# Patient Record
Sex: Female | Born: 2000
Health system: Southern US, Community
[De-identification: ages and names within clinical notes are randomized; demographics above are authoritative.]

## PROBLEM LIST (undated history)

## (undated) DIAGNOSIS — D649 Anemia, unspecified: Secondary | ICD-10-CM

## (undated) DIAGNOSIS — Z789 Other specified health status: Secondary | ICD-10-CM

## (undated) DIAGNOSIS — Z8489 Family history of other specified conditions: Secondary | ICD-10-CM

## (undated) HISTORY — DX: Other specified health status: Z78.9

## (undated) HISTORY — PX: NO PAST SURGERIES: SHX2092

## (undated) HISTORY — DX: Family history of other specified conditions: Z84.89

## (undated) NOTE — *Deleted (*Deleted)
LABOR AND DELIVERY ADMISSION HISTORY AND PHYSICAL NOTE  Teresa [name] is a 39 y.o. female G1P0 with IUP at [redacted]w[redacted]d by *** presenting for ***.  She reports positive fetal movement. She denies leakage of fluid or vaginal bleeding.  Prenatal History/Complications: PNC at *** Pregnancy complications:  - ***  Past Medical History: Past Medical History:  Diagnosis Date  . Family history of adverse reaction to anesthesia    lidocaine does not work on her mother  . Medical history non-contributory     Past Surgical History: Past Surgical History:  Procedure Laterality Date  . NO PAST SURGERIES      Obstetrical History: OB History    Gravida  1   Para      Term      Preterm      AB      Living  0     SAB      TAB      Ectopic      Multiple      Live Births              Social History: Social History   Socioeconomic History  . Marital status: Single    Spouse name: Not on file  . Number of children: Not on file  . Years of education: Not on file  . Highest education level: Associate degree: occupational, Scientist, product/process development, or vocational program  Occupational History    Comment: Car Dealership  Tobacco Use  . Smoking status: Never Smoker  . Smokeless tobacco: Never Used  Vaping Use  . Vaping Use: Never used  Substance and Sexual Activity  . Alcohol use: Not Currently  . Drug use: Never  . Sexual activity: Not Currently    Birth control/protection: None  Other Topics Concern  . Not on file  Social History Narrative   Late to care   Social Determinants of Health   Financial Resource Strain:   . Difficulty of Paying Living Expenses: Not on file  Food Insecurity:   . Worried About Programme researcher, broadcasting/film/video in the Last Year: Not on file  . Ran Out of Food in the Last Year: Not on file  Transportation Needs:   . Lack of Transportation (Medical): Not on file  . Lack of Transportation (Non-Medical): Not on file  Physical Activity:   . Days of Exercise per  Week: Not on file  . Minutes of Exercise per Session: Not on file  Stress:   . Feeling of Stress : Not on file  Social Connections:   . Frequency of Communication with Friends and Family: Not on file  . Frequency of Social Gatherings with Friends and Family: Not on file  . Attends Religious Services: Not on file  . Active Member of Clubs or Organizations: Not on file  . Attends Banker Meetings: Not on file  . Marital Status: Not on file    Family History: Family History  Problem Relation Age of Onset  . Diabetes Father     Allergies: No Known Allergies  Medications Prior to Admission  Medication Sig Dispense Refill Last Dose  . Prenatal Vit w/Fe-Methylfol-FA (PNV PO) Take by mouth.   07/01/2020 at Unknown time     Review of Systems  All systems reviewed and negative except as stated in HPI  Physical Exam Blood pressure 128/78, pulse (!) 114, temperature 98.4 F (36.9 C), temperature source Oral, resp. rate 20, height 5\' 7"  (1.702 m), weight 110 kg, last menstrual period  09/23/2019, SpO2 99 %. General appearance: alert, oriented, NAD*** Lungs: normal respiratory effort Heart: regular rate Abdomen: soft, non-tender; gravid, FH appropriate for [place] Extremities: No calf swelling or tenderness Presentation: cephalic*** Fetal monitoring: *** Uterine activity: ***    Prenatal labs: ABO, Rh: A/Positive/-- (08/31 0902) Antibody: Negative (08/31 0902) Rubella: 1.95 (08/31 0902) RPR: Non Reactive (08/31 0902)  HBsAg: Negative (08/31 0902)  HIV: Non Reactive (08/31 0902)  GC/Chlamydia: *** GBS: Negative/-- (10/07 0913)  2-hr GTT: *** Genetic screening:  *** Anatomy US: ***  Prenatal Transfer Tool  Maternal Diabetes: {Maternal Diabetes:3043596} Genetic Screening: {Genetic Screening:20205} Maternal Ultrasounds/Referrals: {Maternal Ultrasounds / Referrals:20211} Fetal Ultrasounds or other Referrals:  {Fetal Ultrasounds or Other Referrals:20213} Maternal  Substance Abuse:  {Maternal Substance Abuse:20223} Significant Maternal Medications:  {Significant Maternal Meds:20233} Significant Maternal Lab Results: {Significant Maternal Lab Results:20235}  Results for orders placed or performed during the hospital encounter of 07/02/20 (from the past 24 hour(s))  [employer] Time: 07/02/20  1:42 PM  Result Value Ref Range   POCT Fern Test Positive = ruptured amniotic membanes     Patient Active Problem List   Diagnosis Date Noted  . Late prenatal care affecting pregnancy, antepartum 04/29/2020  . Supervision of low-risk pregnancy 12/12/2019    Assessment: Teresa [name] is a 80 y.o. G1P0 at [redacted]w[redacted]d here for ***  #Labor: *** #Pain: *** #FWB: *** #ID:  *** #MOF: *** #MOC:*** #Circ:  ***  De Hollingshead 07/02/2020, 1:56 PM

---
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
## 2019-08-31 NOTE — L&D Delivery Note (Addendum)
Patient: Teresa [name] MRN: [id number]  GBS status: Neg, IAP not given  Patient is a 19 y.o. now G1P1 s/p NSVD at [redacted]w[redacted]d, who was admitted for PROM. ROM 34h 35m prior to delivery with light meconium fluid.    Delivery Note At 9:25 AM a viable and healthy female was delivered via Vaginal, Spontaneous (Presentation: ROA ).  APGAR: 9, 9; weight  7 lb 11.5 oz (3501).    Placenta status: Spontaneous,  intact.  Cord: 3 vessels  with the following complications: None.  Cord pH: N/a; gases not obtained.   Head delivered LOA. No nuchal cord present. Shoulder and body delivered in usual fashion. Infant with spontaneous cry, placed on mother's abdomen, dried and bulb suctioned. Cord clamped x 2 after 1-minute delay, and cut by family member. Cord blood drawn. Placenta delivered spontaneously with gentle cord traction. Fundus firm with massage and Pitocin. Perineum inspected and found to have 1st degree laceration, which was found to be hemostatic. Which was not in need of repair.   Anesthesia: Epidural Episiotomy:  N/A Lacerations: 1st degree perineum Suture Repair:  no repair necessary; hemostatic   Est. Blood Loss (mL):  150  Mom to postpartum.  Baby to Couplet care / Skin to Skin.  Lavonda Jumbo, DO PGY-2, [facility] 07/03/2020, 9:40 AM  I was present and gloved for the entire delivery. I agree with the findings and the plan of care as documented in the resident's note.  Casper Harrison, MD [facility] Fellow, [facility] for [facility], [facility]

## 2019-11-08 ENCOUNTER — Other Ambulatory Visit: Payer: Self-pay

## 2019-11-08 ENCOUNTER — Encounter: Payer: Self-pay | Admitting: Adult Health Nurse Practitioner

## 2019-11-08 ENCOUNTER — Telehealth: Payer: Self-pay | Admitting: Adult Health Nurse Practitioner

## 2019-11-08 ENCOUNTER — Ambulatory Visit (INDEPENDENT_AMBULATORY_CARE_PROVIDER_SITE_OTHER): Payer: BC Managed Care – PPO | Admitting: Adult Health Nurse Practitioner

## 2019-11-08 VITALS — BP 115/75 | HR 79 | Temp 98.3°F | Ht 67.0 in | Wt 219.8 lb

## 2019-11-08 DIAGNOSIS — Z3201 Encounter for pregnancy test, result positive: Secondary | ICD-10-CM | POA: Diagnosis not present

## 2019-11-08 DIAGNOSIS — Z3A01 Less than 8 weeks gestation of pregnancy: Secondary | ICD-10-CM | POA: Insufficient documentation

## 2019-11-08 DIAGNOSIS — N926 Irregular menstruation, unspecified: Secondary | ICD-10-CM | POA: Diagnosis not present

## 2019-11-08 LAB — POCT URINE PREGNANCY: Preg Test, Ur: POSITIVE — AB

## 2019-11-08 NOTE — Progress Notes (Signed)
  Chief Complaint  Patient presents with  . Establish Care    Pregnacy confirmation    HPI   Patient presents after a missed period.  LmP was 09/23/19.  She has taken a pregnancy test at home which was positive.  She does want to keep the pregnancy.  She will be 7 weeks on March 15.  Due date Nov. 1  Only experiencing a little nausea first thing in the a.m. She is taking PNVs.    Problem List    Problem List: There are no relevant problems documented for this patient.   Allergies   has no allergies on file.  Medications   No current outpatient medications on file.   Review of Systems    Constitutional: Negative for activity change, appetite change, chills and fever.  HENT: Negative for congestion, nosebleeds, trouble swallowing and voice change.   Respiratory: Negative for cough, shortness of breath and wheezing.   Cardiac:  Negative for chest pain, pressure, syncope  Gastrointestinal: Negative for diarrhea, nausea and vomiting.  Genitourinary: Negative for difficulty urinating, dysuria, flank pain and hematuria.  Musculoskeletal: Negative for back pain, joint swelling and neck pain.  Neurological: Negative for dizziness, speech difficulty, light-headedness and numbness.  See HPI. All other review of systems negative.     Physical Exam:    height is 5\' 7"  (1.702 m) and weight is 219 lb 12.8 oz (99.7 kg). Her temporal temperature is 98.3 F (36.8 C). Her blood pressure is 115/75 and her pulse is 79. Her oxygen saturation is 98%.   Physical Examination: General appearance - alert, well appearing, and in no distress and oriented to person, place, and time Mental status - normal mood, behavior, speech, dress, motor activity, and thought processes Eyes - PERRL. Extraocular movements intact.  No nystagmus.  Neck - supple, no significant adenopathy, carotids upstroke normal bilaterally, no bruits, thyroid exam: thyroid is normal in size without nodules or tenderness Chest -  clear to auscultation, no wheezes, rales or rhonchi, symmetric air entry  Heart - normal rate, regular rhythm, normal S1, S2, no murmurs, rubs, clicks or gallops Extremities - dependent LE edema without clubbing or cyanosis Skin - normal coloration and turgor, no rashes, no suspicious skin lesions noted  No hyperpigmentation of skin.  No current hematomas noted   Lab /Imaging Review    Positive UPT.   Assessment & Plan:  Teresa [name] is a 19 y.o. female    1. Missed period   2. Less than [redacted] weeks gestation of pregnancy    Orders Placed This Encounter  Procedures  . Ambulatory referral to Obstetrics / Gynecology  . POCT urine pregnancy   Reviewed trimesters and expectations.  All questions answered.   26, NP

## 2019-11-08 NOTE — Telephone Encounter (Signed)
Patient I wanting a call back with urine pregnancy results

## 2019-11-08 NOTE — Telephone Encounter (Signed)
Spoke with pt to let her know that it is confirmed that she is preg.

## 2019-11-08 NOTE — Patient Instructions (Signed)
° ° ° °  If you have lab work done today you will be contacted with your lab results within the next 2 weeks.  If you have not heard from us then please contact us. The fastest way to get your results is to register for My Chart. ° ° °IF you received an x-ray today, you will receive an invoice from [facility]. Please contact  Radiology at [phone] with questions or concerns regarding your invoice.  ° °IF you received labwork today, you will receive an invoice from [facility]. Please contact [facility] at [phone] with questions or concerns regarding your invoice.  ° °Our billing staff will not be able to assist you with questions regarding bills from these companies. ° °You will be contacted with the lab results as soon as they are available. The fastest way to get your results is to activate your My Chart account. Instructions are located on the last page of this paperwork. If you have not heard from us regarding the results in 2 weeks, please contact this office. °  ° ° ° °

## 2019-11-21 DIAGNOSIS — Z7189 Other specified counseling: Secondary | ICD-10-CM | POA: Diagnosis not present

## 2019-11-21 DIAGNOSIS — Z20828 Contact with and (suspected) exposure to other viral communicable diseases: Secondary | ICD-10-CM | POA: Diagnosis not present

## 2019-12-12 ENCOUNTER — Telehealth: Payer: Self-pay | Admitting: Family Medicine

## 2019-12-12 ENCOUNTER — Ambulatory Visit (INDEPENDENT_AMBULATORY_CARE_PROVIDER_SITE_OTHER): Payer: Self-pay | Admitting: *Deleted

## 2019-12-12 DIAGNOSIS — Z3490 Encounter for supervision of normal pregnancy, unspecified, unspecified trimester: Secondary | ICD-10-CM

## 2019-12-12 NOTE — Telephone Encounter (Signed)
Attempted to contact patient to get her rescheduled for her missed intake appointment. No answer, left voicemail for patient to give the office a call back.

## 2019-12-12 NOTE — Progress Notes (Signed)
I connected with  Teresa [name] on 12/12/19 at  2:30 PM EDT by telephone and verified that I am speaking with the correct person using two identifiers.   I discussed the limitations, risks, security and privacy concerns of performing an evaluation and management service by telephone and the availability of in person appointments. I also discussed with the patient that there may be a patient responsible charge related to this service. The patient expressed understanding and agreed to proceed. Call was disconnected. I called back to [name] three times  and left messages our call was disconnected and I am calling back to finish our visit. If we do not reconnect ; you will need to call us to reschedule your appointment.  Shalin Linders,RN  12/12/2019  2:23 PM   2:40pm; I called [name] again and left another message I am calling to see if we can reconnect so we can complete your visit. Please call our office to reschedule your appointments.  Anshi Jalloh,RN

## 2019-12-13 ENCOUNTER — Encounter: Payer: Self-pay | Admitting: Obstetrics and Gynecology

## 2019-12-19 ENCOUNTER — Telehealth: Payer: Self-pay | Admitting: Family Medicine

## 2019-12-19 NOTE — Telephone Encounter (Signed)
Attempted to contact patient to get her scheduled for her intake appointment at our office to start prenatal care. Patient answered the phone and verbalized that it was her. When I introduced myself patient hung up the phone. Patient was called x4 and it went straight to voicemail. A message was left for patient to give the office a call back. New on intake appointment was canceled

## 2020-04-17 ENCOUNTER — Telehealth: Payer: Self-pay | Admitting: General Practice

## 2020-04-17 NOTE — Telephone Encounter (Signed)
Left message on VM informing patient of appt has been rescheduled to 04/29/2020 at 8:30am.  Asked patient to give our office a call.

## 2020-04-29 ENCOUNTER — Other Ambulatory Visit: Payer: Self-pay

## 2020-04-29 ENCOUNTER — Ambulatory Visit (INDEPENDENT_AMBULATORY_CARE_PROVIDER_SITE_OTHER): Payer: BC Managed Care – PPO | Admitting: *Deleted

## 2020-04-29 VITALS — BP 107/59 | HR 84 | Temp 98.0°F | Wt 226.8 lb

## 2020-04-29 DIAGNOSIS — Z3490 Encounter for supervision of normal pregnancy, unspecified, unspecified trimester: Secondary | ICD-10-CM

## 2020-04-29 DIAGNOSIS — Z31430 Encounter of female for testing for genetic disease carrier status for procreative management: Secondary | ICD-10-CM | POA: Diagnosis not present

## 2020-04-29 DIAGNOSIS — O0930 Supervision of pregnancy with insufficient antenatal care, unspecified trimester: Secondary | ICD-10-CM | POA: Insufficient documentation

## 2020-04-29 DIAGNOSIS — Z3482 Encounter for supervision of other normal pregnancy, second trimester: Secondary | ICD-10-CM | POA: Diagnosis not present

## 2020-04-29 NOTE — Progress Notes (Signed)
   PRENATAL INTAKE SUMMARY  Teresa [name] presents today New OB Nurse Interview.  OB History    Gravida  1   Para      Term      Preterm      AB      Living        SAB      TAB      Ectopic      Multiple      Live Births             I have reviewed the patient's medical, obstetrical, social, and family histories, medications, and available lab results.  SUBJECTIVE She has no unusual complaints.  OBJECTIVE Initial nurse interview for history and labs (New OB).  LATE TO CARE  EDD: 07/05/20 by ultrasound completed in [place]:[redacted]w[redacted]d G1P0 FHT: 143  GENERAL APPEARANCE: alert, well appearing, in no apparent distress, oriented to person, place and time   ASSESSMENT Normal pregnancy  PLAN Prenatal care-[facility] Pnl/HIV/Hep C OB Urine Culture GC/CT- to be completed at next visit 05/08/20 Teresa [name], CNM HgbEval/SMA/CF ([facility]) Panorama 2 hr gtt Ultrasound MFM +14 complete for anatomy scan  Teresa Pu, RN

## 2020-04-29 NOTE — Patient Instructions (Addendum)
Third Trimester of Pregnancy  The third trimester is from week 28 through week 40 (months 7 through 9). This trimester is when your unborn baby (fetus) is growing very fast. At the end of the ninth month, the unborn baby is about 20 inches in length. It weighs about 6-10 pounds. Follow these instructions at home: Medicines  Take over-the-counter and prescription medicines only as told by your doctor. Some medicines are safe and some medicines are not safe during pregnancy.  Take a prenatal vitamin that contains at least 600 micrograms (mcg) of folic acid.  If you have trouble pooping (constipation), take medicine that will make your stool soft (stool softener) if your doctor approves. Eating and drinking   Eat regular, healthy meals.  Avoid raw meat and uncooked cheese.  If you get low calcium from the food you eat, talk to your doctor about taking a daily calcium supplement.  Eat four or five small meals rather than three large meals a day.  Avoid foods that are high in fat and sugars, such as fried and sweet foods.  To prevent constipation: ? Eat foods that are high in fiber, like fresh fruits and vegetables, whole grains, and beans. ? Drink enough fluids to keep your pee (urine) clear or pale yellow. Activity  Exercise only as told by your doctor. Stop exercising if you start to have cramps.  Avoid heavy lifting, wear low heels, and sit up straight.  Do not exercise if it is too hot, too humid, or if you are in a place of great height (high altitude).  You may continue to have sex unless your doctor tells you not to. Relieving pain and discomfort  Wear a good support bra if your breasts are tender.  Take frequent breaks and rest with your legs raised if you have leg cramps or low back pain.  Take warm water baths (sitz baths) to soothe pain or discomfort caused by hemorrhoids. Use hemorrhoid cream if your doctor approves.  If you develop puffy, bulging veins  (varicose veins) in your legs: ? Wear support hose or compression stockings as told by your doctor. ? Raise (elevate) your feet for 15 minutes, 3-4 times a day. ? Limit salt in your food. Safety  Wear your seat belt when driving.  Make a list of emergency phone numbers, including numbers for family, friends, the hospital, and police and fire departments. Preparing for your baby's arrival To prepare for the arrival of your baby:  Take prenatal classes.  Practice driving to the hospital.  Visit the hospital and tour the maternity area.  Talk to your work about taking leave once the baby comes.  Pack your hospital bag.  Prepare the baby's room.  Go to your doctor visits.  Buy a rear-facing car seat. Learn how to install it in your car. General instructions  Do not use hot tubs, steam rooms, or saunas.  Do not use any products that contain nicotine or tobacco, such as cigarettes and e-cigarettes. If you need help quitting, ask your doctor.  Do not drink alcohol.  Do not douche or use tampons or scented sanitary pads.  Do not cross your legs for long periods of time.  Do not travel for long distances unless you must. Only do so if your doctor says it is okay.  Visit your dentist if you have not gone during your pregnancy. Use a soft toothbrush to brush your teeth. Be gentle when you floss.  Avoid cat litter boxes  and soil used by cats. These carry germs that can cause birth defects in the baby and can cause a loss of your baby (miscarriage) or stillbirth.  Keep all your prenatal visits as told by your doctor. This is important. Contact a doctor if:  You are not sure if you are in labor or if your water has broken.  You are dizzy.  You have mild cramps or pressure in your lower belly.  You have a nagging pain in your belly area.  You continue to feel sick to your stomach, you throw up, or you have watery poop.  You have bad smelling fluid coming from your  vagina.  You have pain when you pee. Get help right away if:  You have a fever.  You are leaking fluid from your vagina.  You are spotting or bleeding from your vagina.  You have severe belly cramps or pain.  You lose or gain weight quickly.  You have trouble catching your breath and have chest pain.  You notice sudden or extreme puffiness (swelling) of your face, hands, ankles, feet, or legs.  You have not felt the baby move in over an hour.  You have severe headaches that do not go away with medicine.  You have trouble seeing.  You are leaking, or you are having a gush of fluid, from your vagina before you are 37 weeks.  You have regular belly spasms (contractions) before you are 37 weeks. Summary  The third trimester is from week 28 through week 40 (months 7 through 9). This time is when your unborn baby is growing very fast.  Follow your doctor's advice about medicine, food, and activity.  Get ready for the arrival of your baby by taking prenatal classes, getting all the baby items ready, preparing the baby's room, and visiting your doctor to be checked.  Get help right away if you are bleeding from your vagina, or you have chest pain and trouble catching your breath, or if you have not felt your baby move in over an hour. This information is not intended to replace advice given to you by your health care provider. Make sure you discuss any questions you have with your health care provider. Document Revised: 12/07/2018 Document Reviewed: 09/21/2016 Elsevier Patient Education  2020 [employer].  Warning Signs During Pregnancy A pregnancy lasts about 40 weeks, starting from the first day of your last period until the baby is born. Pregnancy is divided into three phases called trimesters.  The first trimester refers to week 1 through week 13 of pregnancy.  The second trimester is the start of week 14 through the end of week 27.  The third trimester is the start of  week 28 until you deliver your baby. During each trimester of pregnancy, certain signs and symptoms may indicate a problem. Talk with your health care provider about your current health and any medical conditions you have. Make sure you know the symptoms that you should watch for and report. How does this affect me?  Warning signs in the first trimester While some changes during the first trimester may be uncomfortable, most do not represent a serious problem. Let your health care provider know if you have any of the following warning signs in the first trimester:  You cannot eat or drink without vomiting, and this lasts for longer than a day.  You have vaginal bleeding or spotting along with menstrual-like cramping.  You have diarrhea for longer than a day.  You have a fever or other signs of infection, such as: ? Pain or burning when you urinate. ? Foul smelling or thick or yellowish vaginal discharge. Warning signs in the second trimester As your baby grows and changes during the second trimester, there are additional signs and symptoms that may indicate a problem. These include:  Signs and symptoms of infection, including a fever.  Signs or symptoms of a miscarriage or preterm labor, such as regular contractions, menstrual-like cramping, or lower abdominal pain.  Bloody or watery vaginal discharge or obvious vaginal bleeding.  Feeling like your heart is pounding.  Having trouble breathing.  Nausea, vomiting, or diarrhea that lasts for longer than a day.  Craving non-food items, such as clay, chalk, or dirt. This may be a sign of a very treatable medical condition called pica. Later in your second trimester, watch for signs and symptoms of a serious medical condition called preeclampsia.These include:  Changes in your vision.  A severe headache that does not go away.  Nausea and vomiting. It is also important to notice if your baby stops moving or moves less than usual  during this time. Warning signs in the third trimester As you approach the third trimester, your baby is growing and your body is preparing for the birth of your baby. In your third trimester, be sure to let your health care provider know if:  You have signs and symptoms of infection, including a fever.  You have vaginal bleeding.  You notice that your baby is moving less than usual or is not moving.  You have nausea, vomiting, or diarrhea that lasts for longer than a day.  You have a severe headache that does not go away.  You have vision changes, including seeing spots or having blurry or double vision.  You have increased swelling in your hands or face. How does this affect my baby? Throughout your pregnancy, always report any of the warning signs of a problem to your health care provider. This can help prevent complications that may affect your baby, including:  Increased risk for premature birth.  Infection that may be transmitted to your baby.  Increased risk for stillbirth. Contact a health care provider if:  You have any of the warning signs of a problem for the current trimester of your pregnancy.  Any of the following apply to you during any trimester of pregnancy: ? You have strong emotions, such as sadness or anxiety, that interfere with work or personal relationships. ? You feel unsafe in your home and need help finding a safe place to live. ? You are using tobacco products, alcohol, or drugs and you need help to stop. Get help right away if: You have signs or symptoms of labor before 37 weeks of pregnancy. These include:  Contractions that are 5 minutes or less apart, or that increase in frequency, intensity, or length.  Sudden, sharp abdominal pain or low back pain.  Uncontrolled gush or trickle of fluid from your vagina. Summary  A pregnancy lasts about 40 weeks, starting from the first day of your last period until the baby is born. Pregnancy is divided  into three phases called trimesters. Each trimester has warning signs to watch for.  Always report any warning signs to your health care provider in order to prevent complications that may affect both you and your baby.  Talk with your health care provider about your current health and any medical conditions you have. Make sure you know the symptoms that  you should watch for and report. This information is not intended to replace advice given to you by your health care provider. Make sure you discuss any questions you have with your health care provider. Document Revised: 12/05/2018 Document Reviewed: 06/02/2017 Elsevier Patient Education  2020 [employer].  Genetic Testing During Pregnancy Genetic testing during pregnancy is also called prenatal genetic testing. This type of testing can determine if your baby is at risk of being born with a disorder caused by abnormal genes or chromosomes (genetic disorder). Chromosomes contain genes that control how your baby will develop in your womb. There are many different genetic disorders. Examples of genetic disorders that may be found through genetic testing include Down syndrome and cystic fibrosis. Gene changes (mutations) can be passed down through families. Genetic testing is offered to all women before or during pregnancy. You can choose whether to have genetic testing. Why is genetic testing done? Genetic testing is done during pregnancy to find out whether your child is at risk for a genetic disorder. Having genetic testing allows you to:  Discuss your test results and options with a genetic counselor.  Prepare for a baby that may be born with a genetic disorder. Learning about the disorder ahead of time helps you be better prepared to manage it. Your health care providers can also be prepared in case your baby requires special care before or after birth.  Consider whether you want to continue with the pregnancy. In some cases, genetic testing  may be done to learn about the traits a child will inherit. Types of genetic tests There are two basic types of genetic testing. Screening tests indicate whether your developing baby (fetus) is at higher risk for a genetic disorder. Diagnostic tests check actual fetal cells to diagnose a genetic disorder. Screening tests     Screening tests will not harm your baby. They are recommended for all pregnant women. Types of screening tests include:  Carrier screening. This test involves checking genes from both parents by testing their blood or saliva. The test checks to find out if the parents carry a genetic mutation that may be passed to a baby. In most cases, both parents must carry the mutation for a baby to be at risk.  First trimester screening. This test combines a blood test with sound wave imaging of your baby (fetal ultrasound). This screening test checks for a risk of Down syndrome or other defects caused by having extra chromosomes. It also checks for defects of the heart, abdomen, or skeleton.  Second trimester screening also combines a blood test with a fetal ultrasound exam. It checks for a risk of genetic defects of the face, brain, spine, heart, or limbs.  Combined or sequential screening. This type of testing combines the results of first and second trimester screening. This type of testing may be more accurate than first or second trimester screening alone.  Cell-free DNA testing. This is a blood test that detects cells released by the placenta that get into the mother's blood. It can be used to check for a risk of Down syndrome, other extra chromosome syndromes, and disorders caused by abnormal numbers of sex chromosomes. This test can be done any time after 10 weeks of pregnancy.  Diagnostic tests Diagnostic tests carry slight risks of problems, including bleeding, infection, and loss of the pregnancy. These tests are done only if your baby is at risk for a genetic disorder. You  may meet with a genetic counselor to discuss the  risks and benefits before having diagnostic tests. Examples of diagnostic tests include:  Chorionic villus sampling (CVS). This involves a procedure to remove and test a sample of cells taken from the placenta. The procedure may be done between 10 and 12 weeks of pregnancy.  Amniocentesis. This involves a procedure to remove and test a sample of fluid (amniotic fluid) and cells from the sac that surrounds the developing baby. The procedure may be done between 15 and 20 weeks of pregnancy. What do the results mean? For a screening test:  If the results are negative, it often means that your child is not at higher risk. There is still a slight chance your child could have a genetic disorder.  If the results are positive, it does not mean your child will have a genetic disorder. It may mean that your child has a higher-than-normal risk for a genetic disorder. In that case, you may want to talk with a genetic counselor about whether you should have diagnostic genetic tests. For a diagnostic test:  If the result is negative, it is unlikely that your child will have a genetic disorder.  If the test is positive for a genetic disorder, it is likely that your child will have the disorder. The test may not tell how severe the disorder will be. Talk with your health care provider about your options. Questions to ask your health care provider Before talking to your health care provider about genetic testing, find out if there is a history of genetic disorders in your family. It may also help to know your family's ethnic origins. Then ask your health care provider the following questions:  Is my baby at risk for a genetic disorder?  What are the benefits of having genetic screening?  What tests are best for me and my baby?  What are the risks of each test?  If I get a positive result on a screening test, what is the next step?  Should I meet with a  genetic counselor before having a diagnostic test?  Should my partner or other members of my family be tested?  How much do the tests cost? Will my insurance cover the testing? Summary  Genetic testing is done during pregnancy to find out whether your child is at risk for a genetic disorder.  Genetic testing is offered to all women before or during pregnancy. You can choose whether to have genetic testing.  There are two basic types of genetic testing. Screening tests indicate whether your developing baby (fetus) is at higher risk for a genetic disorder. Diagnostic tests check actual fetal cells to diagnose a genetic disorder.  If a diagnostic genetic test is positive, talk with your health care provider about your options. This information is not intended to replace advice given to you by your health care provider. Make sure you discuss any questions you have with your health care provider. Document Revised: 12/07/2018 Document Reviewed: 10/31/2017 Elsevier Patient Education  2020 [employer].

## 2020-04-30 ENCOUNTER — Encounter: Payer: Self-pay | Admitting: General Practice

## 2020-04-30 ENCOUNTER — Other Ambulatory Visit (HOSPITAL_COMMUNITY)
Admission: RE | Admit: 2020-04-30 | Discharge: 2020-04-30 | Disposition: A | Discharge status: Home / Self Care | Payer: BC Managed Care – PPO | Source: Ambulatory Visit | Attending: Obstetrics and Gynecology | Admitting: Obstetrics and Gynecology

## 2020-04-30 ENCOUNTER — Ambulatory Visit (INDEPENDENT_AMBULATORY_CARE_PROVIDER_SITE_OTHER): Payer: BC Managed Care – PPO | Admitting: Obstetrics and Gynecology

## 2020-04-30 ENCOUNTER — Encounter: Payer: Self-pay | Admitting: Obstetrics and Gynecology

## 2020-04-30 VITALS — BP 116/72 | HR 90 | Temp 98.2°F | Wt 228.0 lb

## 2020-04-30 DIAGNOSIS — O0930 Supervision of pregnancy with insufficient antenatal care, unspecified trimester: Secondary | ICD-10-CM

## 2020-04-30 DIAGNOSIS — O26843 Uterine size-date discrepancy, third trimester: Secondary | ICD-10-CM

## 2020-04-30 DIAGNOSIS — Z3490 Encounter for supervision of normal pregnancy, unspecified, unspecified trimester: Secondary | ICD-10-CM | POA: Diagnosis not present

## 2020-04-30 DIAGNOSIS — Z3A30 30 weeks gestation of pregnancy: Secondary | ICD-10-CM

## 2020-04-30 LAB — CBC/D/PLT+RPR+RH+ABO+RUB AB...
Antibody Screen: NEGATIVE
Basophils Absolute: 0 10*3/uL (ref 0.0–0.2)
Basos: 0 %
EOS (ABSOLUTE): 0 10*3/uL (ref 0.0–0.4)
Eos: 0 %
HCV Ab: <0.1 s/co ratio (ref 0.0–0.9)
HIV Screen 4th Generation wRfx: NONREACTIVE
Hematocrit: 31.1 % — ABNORMAL LOW (ref 34.0–46.6)
Hemoglobin: 10.6 g/dL — ABNORMAL LOW (ref 11.1–15.9)
Hepatitis B Surface Ag: NEGATIVE
Immature Grans (Abs): 0.1 10*3/uL (ref 0.0–0.1)
Immature Granulocytes: 1 %
Lymphocytes Absolute: 2.4 10*3/uL (ref 0.7–3.1)
Lymphs: 24 %
MCH: 30.8 pg (ref 26.6–33.0)
MCHC: 34.1 g/dL (ref 31.5–35.7)
MCV: 90 fL (ref 79–97)
Monocytes Absolute: 0.7 10*3/uL (ref 0.1–0.9)
Monocytes: 7 %
Neutrophils Absolute: 6.6 10*3/uL (ref 1.4–7.0)
Neutrophils: 68 %
Platelets: 201 10*3/uL (ref 150–450)
RBC: 3.44 x10E6/uL — ABNORMAL LOW (ref 3.77–5.28)
RDW: 13.4 % (ref 11.7–15.4)
RPR Ser Ql: NONREACTIVE
Rh Factor: POSITIVE
Rubella Antibodies, IGG: 1.95 index (ref >0.99)
WBC: 9.8 10*3/uL (ref 3.4–10.8)

## 2020-04-30 LAB — GLUCOSE TOLERANCE, 2 HOURS W/ 1HR
Glucose, 1 hour: 84 mg/dL (ref 65–179)
Glucose, 2 hour: 92 mg/dL (ref 65–152)
Glucose, Fasting: 69 mg/dL (ref 65–91)

## 2020-04-30 LAB — HCV INTERPRETATION

## 2020-04-30 NOTE — Patient Instructions (Signed)
Iron-Rich Diet  Iron is a mineral that helps your body to produce hemoglobin. Hemoglobin is a protein in red blood cells that carries oxygen to your body's tissues. Eating too little iron may cause you to feel weak and tired, and it can increase your risk of infection. Iron is naturally found in many foods, and many foods have iron added to them (iron-fortified foods). You may need to follow an iron-rich diet if you do not have enough iron in your body due to certain medical conditions. The amount of iron that you need each day depends on your age, your sex, and any medical conditions you have. Follow instructions from your health care provider or a diet and nutrition specialist (dietitian) about how much iron you should eat each day. What are tips for following this plan? Reading food labels  Check food labels to see how many milligrams (mg) of iron are in each serving. Cooking  Cook foods in pots and pans that are made from iron.  Take these steps to make it easier for your body to absorb iron from certain foods: ? Soak beans overnight before cooking. ? Soak whole grains overnight and drain them before using. ? Ferment flours before baking, such as by using yeast in bread dough. Meal planning  When you eat foods that contain iron, you should eat them with foods that are high in vitamin C. These include oranges, peppers, tomatoes, potatoes, and mango. Vitamin C helps your body to absorb iron. General information  Take iron supplements only as told by your health care provider. An overdose of iron can be life-threatening. If you were prescribed iron supplements, take them with orange juice or a vitamin C supplement.  When you eat iron-fortified foods or take an iron supplement, you should also eat foods that naturally contain iron, such as meat, poultry, and fish. Eating naturally iron-rich foods helps your body to absorb the iron that is added to other foods or contained in a  supplement.  Certain foods and drinks prevent your body from absorbing iron properly. Avoid eating these foods in the same meal as iron-rich foods or with iron supplements. These foods include: ? Coffee, black tea, and red wine. ? Milk, dairy products, and foods that are high in calcium. ? Beans and soybeans. ? Whole grains. What foods should I eat? Fruits Prunes. Raisins. Eat fruits high in vitamin C, such as oranges, grapefruits, and strawberries, alongside iron-rich foods. Vegetables Spinach (cooked). Green peas. Broccoli. Fermented vegetables. Eat vegetables high in vitamin C, such as leafy greens, potatoes, bell peppers, and tomatoes, alongside iron-rich foods. Grains Iron-fortified breakfast cereal. Iron-fortified whole-wheat bread. Enriched rice. Sprouted grains. Meats and other proteins Beef liver. Oysters. Beef. Shrimp. Turkey. Chicken. Tuna. Sardines. Chickpeas. Nuts. Tofu. Pumpkin seeds. Beverages Tomato juice. Fresh orange juice. Prune juice. Hibiscus tea. Fortified instant breakfast shakes. Sweets and desserts Blackstrap molasses. Seasonings and condiments Tahini. Fermented soy sauce. Other foods Wheat germ. The items listed above may not be a complete list of recommended foods and beverages. Contact a dietitian for more information. What foods should I avoid? Grains Whole grains. Bran cereal. Bran flour. Oats. Meats and other proteins Soybeans. Products made from soy protein. Black beans. Lentils. Mung beans. Split peas. Dairy Milk. Cream. Cheese. Yogurt. Cottage cheese. Beverages Coffee. Black tea. Red wine. Sweets and desserts Cocoa. Chocolate. Ice cream. Other foods Basil. Oregano. Large amounts of parsley. The items listed above may not be a complete list of foods and beverages to avoid.   Contact a dietitian for more information. Summary  Iron is a mineral that helps your body to produce hemoglobin. Hemoglobin is a protein in red blood cells that carries  oxygen to your body's tissues.  Iron is naturally found in many foods, and many foods have iron added to them (iron-fortified foods).  When you eat foods that contain iron, you should eat them with foods that are high in vitamin C. Vitamin C helps your body to absorb iron.  Certain foods and drinks prevent your body from absorbing iron properly, such as whole grains and dairy products. You should avoid eating these foods in the same meal as iron-rich foods or with iron supplements. This information is not intended to replace advice given to you by your health care provider. Make sure you discuss any questions you have with your health care provider. Document Revised: 07/29/2017 Document Reviewed: 07/12/2017 Elsevier Patient Education  2020 [employer] Fetal Movement Counts Patient Name: ________________________________________________ Patient Due Date: ____________________ What is a fetal movement count?  A fetal movement count is the number of times that you feel your baby move during a certain amount of time. This may also be called a fetal kick count. A fetal movement count is recommended for every pregnant woman. You may be asked to start counting fetal movements as early as week 28 of your pregnancy. Pay attention to when your baby is most active. You may notice your baby's sleep and wake cycles. You may also notice things that make your baby move more. You should do a fetal movement count:  When your baby is normally most active.  At the same time each day. A good time to count movements is while you are resting, after having something to eat and drink. How do I count fetal movements? 1. Find a quiet, comfortable area. Sit, or lie down on your side. 2. Write down the date, the start time and stop time, and the number of movements that you felt between those two times. Take this information with you to your health care visits. 3. Write down your start time when you feel the first  movement. 4. Count kicks, flutters, swishes, rolls, and jabs. You should feel at least 10 movements. 5. You may stop counting after you have felt 10 movements, or if you have been counting for 2 hours. Write down the stop time. 6. If you do not feel 10 movements in 2 hours, contact your health care provider for further instructions. Your health care provider may want to do additional tests to assess your baby's well-being. Contact a health care provider if:  You feel fewer than 10 movements in 2 hours.  Your baby is not moving like he or she usually does. Date: ____________ Start time: ____________ Stop time: ____________ Movements: ____________ Date: ____________ Start time: ____________ Stop time: ____________ Movements: ____________ Date: ____________ Start time: ____________ Stop time: ____________ Movements: ____________ Date: ____________ Start time: ____________ Stop time: ____________ Movements: ____________ Date: ____________ Start time: ____________ Stop time: ____________ Movements: ____________ Date: ____________ Start time: ____________ Stop time: ____________ Movements: ____________ Date: ____________ Start time: ____________ Stop time: ____________ Movements: ____________ Date: ____________ Start time: ____________ Stop time: ____________ Movements: ____________ Date: ____________ Start time: ____________ Stop time: ____________ Movements: ____________ This information is not intended to replace advice given to you by your health care provider. Make sure you discuss any questions you have with your health care provider. Document Revised: 04/05/2019 Document Reviewed: 04/05/2019 Elsevier Patient Education  2020 [employer]  

## 2020-04-30 NOTE — Progress Notes (Signed)
INITIAL OBSTETRICAL VISIT Patient name: Teresa [name] MRN [id number]  Date of birth: 03-11-2001 Chief Complaint:   Initial Prenatal Visit  History of Present Illness:   Teresa [name] is a 19 y.o. G1P0 Hispanic female at [redacted]w[redacted]d by LMP with an Estimated Date of Delivery: 07/05/20 being seen today for her initial obstetrical visit.  Her obstetrical history is significant for obesity and late to care. She reports that she found out she was pregnant in March of this year. She did not seek PNC, because she had to go to [place] to take care of her sick grandmother. She reports having only U/S and no OB care while in [place]; copies in scanned documents. This is an unplanned pregnancy. She and the father of the baby (FOB) "Teresa [name]" live together. She has a support system that consists of the FOB/her family/friends. Today she reports no complaints.   Patient's last menstrual period was 09/23/2019. Last pap n/a. Results were: never had before d/t age Review of Systems:   Pertinent items are noted in HPI Denies cramping/contractions, leakage of fluid, vaginal bleeding, abnormal vaginal discharge w/ itching/odor/irritation, headaches, visual changes, shortness of breath, chest pain, abdominal pain, severe nausea/vomiting, or problems with urination or bowel movements unless otherwise stated above.  Pertinent History Reviewed:  Reviewed past medical,surgical, social, obstetrical and family history.  Reviewed problem list, medications and allergies. OB History  Gravida Para Term Preterm AB Living  1            SAB TAB Ectopic Multiple Live Births               # Outcome Date GA Lbr Len/2nd Weight Sex Delivery Anes PTL Lv  1 Current            Physical Assessment:   Vitals:   04/30/20 1029  BP: 116/72  Pulse: 90  Temp: 98.2 F (36.8 C)  Weight: 228 lb (103.4 kg)  Body mass index is 35.71 kg/m.       Physical Examination:  General appearance - well appearing, and in no  distress  Mental status - alert, oriented to person, place, and time  Psych:  She has a normal mood and affect  Skin - warm and dry, normal color, no suspicious lesions noted  Chest - effort normal, all lung fields clear to auscultation bilaterally  Heart - normal rate and regular rhythm  Abdomen - soft, nontender  Extremities:  No swelling or varicosities noted  Pelvic - VULVA: normal appearing vulva with no masses, tenderness or lesions  VAGINA: normal appearing vagina with normal color and discharge, no lesions.   CERVIX: normal appearing cervix without discharge or lesions, no CMT  Thin prep pap is done with reflex HR HPV cotesting   FHTs by doppler: 150 bpm  Assessment & Plan:  1) Low-Risk Pregnancy G1P0 at [redacted]w[redacted]d with an Estimated Date of Delivery: 07/05/20   2) Initial OB visit - Welcomed to practice and introduced self to patient in addition to discussing other advanced practice providers that she may be seeing at this practice - Congratulated patient - Anticipatory guidance on upcoming appointments - Educated on COVID19 and pregnancy and the integration of virtual appointments  - Educated on babyscripts app- patient reports she has not received email, encouraged to look in spam folder and to call office if she still has not received email - patient verbalizes understanding    3) Late prenatal care affecting pregnancy, antepartum - U/S records from [place] scanned under media  tab   4) Encounter for supervision of low-risk pregnancy, antepartum - Cytology - PAP( [place]) - Cervicovaginal ancillary only( [place]) - Information provided on FKC and iron rich diet  5) [redacted] weeks gestation of pregnancy    Meds: No orders of the defined types were placed in this encounter.   Initial labs obtained Continue prenatal vitamins Reviewed n/v relief measures and warning s/s to report Reviewed recommended weight gain based on pre-gravid BMI Encouraged well-balanced diet Genetic  Screening discussed: ordered Cystic fibrosis, SMA, Fragile X screening discussed ordered The nature of  - [facility] with multiple MDs and other Advanced Practice Providers was explained to patient; also emphasized that residents, students are part of our team.  Discussed optimized OB schedule and video visits. Advised can have an in-office visit whenever she feels she needs to be seen.  Advised to call during normal business hours and there is an after-hours nurse line available.    Follow-up: Return in about 2 weeks (around 05/14/2020) for Return OB visit.   No orders of the defined types were placed in this encounter.   [name] MSN, CNM 04/30/2020

## 2020-05-01 LAB — CERVICOVAGINAL ANCILLARY ONLY
Bacterial Vaginitis (gardnerella): NEGATIVE
Candida Glabrata: NEGATIVE
Candida Vaginitis: NEGATIVE
Chlamydia: NEGATIVE
Comment: NEGATIVE
Comment: NEGATIVE
Comment: NEGATIVE
Comment: NEGATIVE
Comment: NEGATIVE
Comment: NORMAL
Neisseria Gonorrhea: NEGATIVE
Trichomonas: NEGATIVE

## 2020-05-01 LAB — CYTOLOGY - PAP: Diagnosis: NEGATIVE

## 2020-05-01 LAB — CULTURE, OB URINE

## 2020-05-01 LAB — URINE CULTURE, OB REFLEX

## 2020-05-02 ENCOUNTER — Ambulatory Visit: Payer: BC Managed Care – PPO | Attending: Obstetrics and Gynecology

## 2020-05-02 ENCOUNTER — Ambulatory Visit: Payer: BC Managed Care – PPO | Admitting: *Deleted

## 2020-05-02 ENCOUNTER — Other Ambulatory Visit: Payer: Self-pay | Admitting: *Deleted

## 2020-05-02 ENCOUNTER — Other Ambulatory Visit: Payer: Self-pay

## 2020-05-02 DIAGNOSIS — O0930 Supervision of pregnancy with insufficient antenatal care, unspecified trimester: Secondary | ICD-10-CM

## 2020-05-02 DIAGNOSIS — Z3689 Encounter for other specified antenatal screening: Secondary | ICD-10-CM

## 2020-05-02 DIAGNOSIS — Z3490 Encounter for supervision of normal pregnancy, unspecified, unspecified trimester: Secondary | ICD-10-CM

## 2020-05-02 DIAGNOSIS — O26843 Uterine size-date discrepancy, third trimester: Secondary | ICD-10-CM

## 2020-05-07 ENCOUNTER — Encounter: Payer: Self-pay | Admitting: General Practice

## 2020-05-08 ENCOUNTER — Encounter: Payer: BC Managed Care – PPO | Admitting: Obstetrics and Gynecology

## 2020-05-14 ENCOUNTER — Encounter: Payer: Self-pay | Admitting: General Practice

## 2020-05-15 ENCOUNTER — Encounter: Payer: Self-pay | Admitting: Obstetrics and Gynecology

## 2020-05-15 ENCOUNTER — Other Ambulatory Visit: Payer: Self-pay

## 2020-05-15 ENCOUNTER — Ambulatory Visit (INDEPENDENT_AMBULATORY_CARE_PROVIDER_SITE_OTHER): Payer: BC Managed Care – PPO | Admitting: Obstetrics and Gynecology

## 2020-05-15 VITALS — BP 112/75 | HR 81 | Temp 97.7°F | Wt 232.4 lb

## 2020-05-15 DIAGNOSIS — Z3493 Encounter for supervision of normal pregnancy, unspecified, third trimester: Secondary | ICD-10-CM

## 2020-05-15 DIAGNOSIS — Z3A33 33 weeks gestation of pregnancy: Secondary | ICD-10-CM

## 2020-05-15 NOTE — Progress Notes (Signed)
   LOW-RISK PREGNANCY OFFICE VISIT Patient name: Teresa [name] MRN [id number]  Date of birth: [birth year] Chief Complaint:   Routine Prenatal Visit  History of Present Illness:   Teresa [name] is a 19 y.o. G1P0 female at [redacted]w[redacted]d with an Estimated Date of Delivery: 06/29/20 being seen today for ongoing management of a low-risk pregnancy.  Today she reports no complaints. Contractions: Not present. Vag. Bleeding: None.  Movement: Present. denies leaking of fluid. Review of Systems:   Pertinent items are noted in HPI Denies abnormal vaginal discharge w/ itching/odor/irritation, headaches, visual changes, shortness of breath, chest pain, abdominal pain, severe nausea/vomiting, or problems with urination or bowel movements unless otherwise stated above. Pertinent History Reviewed:  Reviewed past medical,surgical, social, obstetrical and family history.  Reviewed problem list, medications and allergies. Physical Assessment:   Vitals:   05/15/20 0840  BP: 112/75  Pulse: 81  Temp: 97.7 F (36.5 C)  Weight: 232 lb 6.4 oz (105.4 kg)  Body mass index is 36.4 kg/m.        Physical Examination:   General appearance: Well appearing, and in no distress  Mental status: Alert, oriented to person, place, and time  Skin: Warm & dry  Cardiovascular: Normal heart rate noted  Respiratory: Normal respiratory effort, no distress  Abdomen: Soft, gravid, nontender  Pelvic: Cervical exam deferred         Extremities: Edema: None  Fetal Status: Fetal Heart Rate (bpm): 141   Movement: Present    No results found for this or any previous visit (from the past 24 hour(s)).  Assessment & Plan:  1) Low-risk pregnancy G1P0 at [redacted]w[redacted]d with an Estimated Date of Delivery: 06/29/20   2) Encounter for supervision of low-risk pregnancy in third trimester - Anticipatory guidance for GBS and cervical exam next visit - Discussed rotation of visits after 36 wks   3) [redacted] weeks gestation of pregnancy    Meds:  No orders of the defined types were placed in this encounter.  Labs/procedures today: none  Plan:  Continue routine obstetrical care   Reviewed: Preterm labor symptoms and general obstetric precautions including but not limited to vaginal bleeding, contractions, leaking of fluid and fetal movement were reviewed in detail with the patient.  All questions were answered. No BP cuff at home.   Follow-up: Return in about 3 weeks (around 06/05/2020) for Return OB w/GBS.  No orders of the defined types were placed in this encounter.  [name] MSN, CNM 05/15/2020 8:50 AM

## 2020-05-29 ENCOUNTER — Ambulatory Visit: Payer: BC Managed Care – PPO | Attending: Obstetrics and Gynecology

## 2020-05-29 ENCOUNTER — Other Ambulatory Visit: Payer: Self-pay

## 2020-05-29 ENCOUNTER — Ambulatory Visit: Payer: BC Managed Care – PPO

## 2020-05-29 DIAGNOSIS — Z362 Encounter for other antenatal screening follow-up: Secondary | ICD-10-CM | POA: Diagnosis not present

## 2020-05-29 DIAGNOSIS — Z3A35 35 weeks gestation of pregnancy: Secondary | ICD-10-CM | POA: Diagnosis not present

## 2020-05-29 DIAGNOSIS — Z3689 Encounter for other specified antenatal screening: Secondary | ICD-10-CM

## 2020-05-29 DIAGNOSIS — O0933 Supervision of pregnancy with insufficient antenatal care, third trimester: Secondary | ICD-10-CM | POA: Diagnosis not present

## 2020-06-05 ENCOUNTER — Ambulatory Visit (INDEPENDENT_AMBULATORY_CARE_PROVIDER_SITE_OTHER): Payer: BC Managed Care – PPO | Admitting: Obstetrics and Gynecology

## 2020-06-05 ENCOUNTER — Other Ambulatory Visit (HOSPITAL_COMMUNITY)
Admission: RE | Admit: 2020-06-05 | Discharge: 2020-06-05 | Disposition: A | Discharge status: Home / Self Care | Payer: BC Managed Care – PPO | Source: Ambulatory Visit | Attending: Obstetrics and Gynecology | Admitting: Obstetrics and Gynecology

## 2020-06-05 ENCOUNTER — Other Ambulatory Visit: Payer: Self-pay

## 2020-06-05 VITALS — BP 106/72 | HR 83 | Temp 98.2°F | Wt 236.2 lb

## 2020-06-05 DIAGNOSIS — Z3490 Encounter for supervision of normal pregnancy, unspecified, unspecified trimester: Secondary | ICD-10-CM | POA: Insufficient documentation

## 2020-06-05 DIAGNOSIS — Z3A36 36 weeks gestation of pregnancy: Secondary | ICD-10-CM

## 2020-06-06 LAB — CERVICOVAGINAL ANCILLARY ONLY
Bacterial Vaginitis (gardnerella): NEGATIVE
Candida Glabrata: NEGATIVE
Candida Vaginitis: NEGATIVE
Chlamydia: NEGATIVE
Comment: NEGATIVE
Comment: NEGATIVE
Comment: NEGATIVE
Comment: NEGATIVE
Comment: NEGATIVE
Comment: NORMAL
Neisseria Gonorrhea: NEGATIVE
Trichomonas: NEGATIVE

## 2020-06-06 NOTE — Progress Notes (Signed)
   LOW-RISK PREGNANCY OFFICE VISIT Patient name: Teresa [name] MRN [id number]  Date of birth: [birth year] Chief Complaint:   Routine Prenatal Visit  History of Present Illness:   Teresa [name] is a 19 y.o. G1P0 female at [redacted]w[redacted]d with an Estimated Date of Delivery: 06/29/20 being seen today for ongoing management of a low-risk pregnancy.  Today she reports no complaints. Contractions: Not present. Vag. Bleeding: None.  Movement: Present. denies leaking of fluid. Review of Systems:   Pertinent items are noted in HPI Denies abnormal vaginal discharge w/ itching/odor/irritation, headaches, visual changes, shortness of breath, chest pain, abdominal pain, severe nausea/vomiting, or problems with urination or bowel movements unless otherwise stated above. Pertinent History Reviewed:  Reviewed past medical,surgical, social, obstetrical and family history.  Reviewed problem list, medications and allergies. Physical Assessment:   Vitals:   06/05/20 0844  BP: 106/72  Pulse: 83  Temp: 98.2 F (36.8 C)  Weight: 236 lb 3.2 oz (107.1 kg)  Body mass index is 36.99 kg/m.        Physical Examination:   General appearance: Well appearing, and in no distress  Mental status: Alert, oriented to person, place, and time  Skin: Warm & dry  Cardiovascular: Normal heart rate noted  Respiratory: Normal respiratory effort, no distress  Abdomen: Soft, gravid, nontender  Pelvic: Cervical exam performed  Dilation: Closed Effacement (%): Thick Station: Ballotable  Extremities: Edema: None  Fetal Status: Fetal Heart Rate (bpm): 144 Fundal Height: 37 cm Movement: Present Presentation: Vertex  No results found for this or any previous visit (from the past 24 hour(s)).  Assessment & Plan:  1) Low-risk pregnancy G1P0 at [redacted]w[redacted]d with an Estimated Date of Delivery: 06/29/20   2) Encounter for supervision of low-risk pregnancy, antepartum  - Culture, beta strep (group b only),  - Cervicovaginal ancillary  only( [place])  3) [redacted] weeks gestation of pregnancy    Meds: No orders of the defined types were placed in this encounter.  Labs/procedures today: none  Patient informed that the ultrasound is considered a limited OB ultrasound and is not intended to be a complete ultrasound exam.  Patient also informed that the ultrasound is not being completed with the intent of assessing for fetal or placental anomalies or any pelvic abnormalities.  Explained that the purpose of today's ultrasound is to assess for presentation.  Baby was found to be in a cephalic presentation. Patient acknowledges the purpose of the exam and the limitations of the study.  Plan:  Continue routine obstetrical care   Reviewed: Preterm labor symptoms and general obstetric precautions including but not limited to vaginal bleeding, contractions, leaking of fluid and fetal movement were reviewed in detail with the patient.  All questions were answered. Has home bp cuff. Check bp weekly, let us know if >140/90.   Follow-up: Return in about 2 weeks (around 06/19/2020) for Return OB - My Chart video.  Orders Placed This Encounter  Procedures  . Culture, beta strep (group b only)   [name] MSN, CNM 06/05/2020

## 2020-06-09 LAB — CULTURE, BETA STREP (GROUP B ONLY): Strep Gp B Culture: NEGATIVE

## 2020-06-20 ENCOUNTER — Other Ambulatory Visit: Payer: Self-pay

## 2020-06-20 ENCOUNTER — Ambulatory Visit (INDEPENDENT_AMBULATORY_CARE_PROVIDER_SITE_OTHER): Payer: BC Managed Care – PPO

## 2020-06-20 VITALS — BP 128/79 | HR 90 | Temp 97.9°F | Wt 241.4 lb

## 2020-06-20 DIAGNOSIS — Z3493 Encounter for supervision of normal pregnancy, unspecified, third trimester: Secondary | ICD-10-CM

## 2020-06-20 DIAGNOSIS — Z3A38 38 weeks gestation of pregnancy: Secondary | ICD-10-CM

## 2020-06-20 NOTE — Progress Notes (Signed)
   Induction Assessment Scheduling Form: Fax to [facility]:  [phone]  Teresa [name]                                                                                   DOB:  [birth year]                                                            MRN:  [id number]                                                                     Phone #:   [phone]                         Provider:  [facility]  GP:  G1P0                                                            Estimated Date of Delivery: 06/29/20  Dating Criteria: LMP    Medical Indications for induction:  Planning Outpt Foley on 10/5 Admission Date/Time:  Sat Nov 6th at [facility] Gestational age on admission:  40 6/7 weeks   Filed Weights   06/20/20 0815  Weight: 241 lb 6.4 oz (109.5 kg)   HIV:  Non Reactive (08/31 0902) GBS: Negative/-- (10/07 0913)  Bishop score TBD   Method of induction(proposed):  Outpatient Foley Bulb   Scheduling Provider Signature:  Cherre Robins, CNM                                            Today's Date:  06/20/2020

## 2020-06-20 NOTE — Patient Instructions (Signed)
AREA PEDIATRIC/FAMILY PRACTICE PHYSICIANS  [place] ([address]) . [facility] o Chambliss, MD; Eniola, MD; Hale, MD; Hensel, MD; McDiarmid, MD; McIntyer, MD; Neal, MD; Walden, MD o [address] [place], [place] [address] o [phone] o Mon-Fri 8:30-12:30, 1:30-5:00 o Providers come to see babies at [facility] o Accepting Medicaid . [facility] o Limited providers who accept newborns: Koirala, MD; Morrow, MD; Wolters, MD o [address], [place], [place] [address] o [phone] o Mon-Fri 8:00-5:30 o Babies seen by providers at [facility] o Does NOT accept Medicaid o Please call early in hospitalization for appointment (limited availability)  . [facility] o Mulberry, MD o [address] [place], [place] [address] o [phone] o Mon, Tue, Thur, Fri 8:30-5:00, Wed 10:00-7:00 (closed 1-2pm) o Babies seen by [facility] providers o Accepting Medicaid . [facility] - Pediatrician o Rubin, MD o [address], [place], [place] [address] o [phone] o Mon-Fri 8:30-5:00, Sat 8:30-12:00 o Provider comes to see babies at [facility] o Accepting Medicaid o Must have been referred from current patients or contacted office prior to delivery . [facility] ([facility]) o Brown, MD; [name], MD; Ettefagh, MD; Grant, MD; Lester, MD; McCormick, MD; McQueen, MD; Prose, MD; Simha, MD; Stanley, MD; Stryffeler, NP; Tebben, NP o [address], [place], [place] [address] o [phone] o Mon, Tue, Thur, Fri 8:30-5:30, Wed 9:30-5:30, Sat 8:30-12:30 o Babies seen by [facility] providers o Accepting Medicaid o Only accepting infants of first-time parents or siblings of current patients o Hospital discharge coordinator will make follow-up appointment . Jack Amos o [address],  [place], [place]  [address] o [phone]   Fax - [phone] . [facility] o [address], [place], [place]  [address] o Phone - [phone]   Fax - [phone] . Shilpa Gosrani o [address], [place], [place]  [address] o [phone]  [place] ([address]) . [facility] o Bates, MD; Brassfield, MD; Cooper, Cox, MD; MD; Davis, MD; Dovico, MD; Ettefaugh, MD; Little, MD; Lowe, MD; Keiffer, MD; Melvin, MD; Sumner, MD; Williams, MD o [address], [place], [place] [address] o [phone] o Mon-Fri 8:30-5:00 (extended evenings Mon-Thur as needed), Sat-Sun 10:00-1:00 o Providers come to see babies at [facility] o Accepting Medicaid for families of first-time babies and families with all children in the household age 3 and under. Must register with office prior to making appointment (M-F only). . [facility] o Henson, NP; Knapp, MD; Lalonde, MD; Tysinger, PA o [address] [place], [place] [address] o [phone] o Mon-Fri 8:00-5:00 o Babies seen by providers at [facility] o Does NOT accept Medicaid/Commercial Insurance Only . [facility] ([facility])  o Artis, MD; Barnes, MD; Bratton, MD; Coccaro, MD; Lockett Gardner, MD; Kramer, MD; Marshall, MD; Netherton, MD; Poleto, MD; Skinner, MD o [address] [place], [place] [address] o [phone] o Mon-Fri 8:30-5:30, Sat (Oct.-Mar.) 9:00-1:00 o Babies seen by providers at [facility] o Accepting Medicaid  [place] ([address]) . [facility] o Reid, MD; Warner, MD o [address], , [place] [address] o [phone] o Mon-Fri 8:30-5:00, Sat 8:30-12:00 o Providers come to see babies at [facility] o Does NOT accept Medicaid . [facility] o Becker, PA; Hagler, MD; Scifres, PA; Sun, MD; Swayne, MD o [address],  [place], [place] [address] o [phone] o Mon-Fri 8:00-5:00 o Babies seen by providers at [facility] o Does NOT accept Medicaid o Only accepting babies of parents who are patients o Please call early in hospitalization for appointment (limited availability) . [facility] o Clark, MD; Frye, MD; Kelleher, MD; Mack, NP; Miller, MD; O'Keller, MD; Patterson, NP; Pudlo, MD; Puzio, MD; Thomas, MD; Tucker, MD; Twiselton, MD o [address], [place], [place] [address] o [phone] o Mon-Fri 8:00-5:00, Sat 9:00-12:00 o Providers come to see babies at [facility] o Does NOT accept Medicaid  [place] ([address]) . [facility] o Limited providers accepting new patients: Brake, NP; Wharton, PA o [address], [place], [place] [address] o [phone] o Mon-Fri 8:00-5:00 o Babies seen by providers at [facility] o Does NOT accept Medicaid o Only accepting babies of parents who are patients o Please call early in hospitalization for appointment (limited availability) . [facility] o Gay, MD; Quinlan, MD o [address] [place], [place] [address] o [phone] (press [phone] to schedule appointment) o Mon-Fri 8:00-5:00 o Providers come to see babies at [facility] o Does NOT accept Medicaid . [facility] o Mazer, MD o [address] [place], [place] [address] o [phone] o Mon-Fri 8:30-5:00 (lunch 12:30-1:00), extended hours by appointment only Wed 5:00-6:30 o Babies seen by [facility] providers o Accepting Medicaid . [facility] o Banks, MD; Jordan, MD; Koberlein, MD o [address], [place], [place] [address] o [phone] o Mon-Fri 8:00-5:00 o Babies seen by [facility] providers o Does NOT accept Medicaid . [facility] o Parker, MD; Hunter, MD; Wallace, DO o [address] [place], [place]  [address] o [phone] o Mon-Fri 8:00-5:00 o Babies seen by [facility] providers o Does NOT accept Medicaid . [facility] o Brandon, PA; Brecken, PA; Christy, NP; Dees, MD; DeClaire, MD; DeWeese, MD; Hansen, NP; Mills, NP; Parrish, NP; Smoot, NP; Summer, MD; Vapne, MD o [address] [place], [place] [address] o [phone] o Mon-Fri 8:30-5:00, Sat 10:00-1:00 o Providers come to see babies at [facility] o Does NOT accept Medicaid o Free prenatal information session Tuesdays at 4:45pm . [facility] o Bouska, MD; Gordon, PA; Jeffery, PA; Weber, PA o [address] [place] [address] o [phone] o Mon-Fri 7:30-5:30 o Babies seen by [facility] providers . [facility] o [address], [place], [place]  [address] o [phone]   Fax - [phone]  [place] ([address]) . [facility] o [place]ese, MD o [address] [place], [place] [address] o [phone] o Mon-Thur 8:00-6:00 o Providers come to see babies at [facility] o Accepting Medicaid . [facility] o Anderson, NP; Badger, MD; Beal, PA; Spencer, PA o [address] [place], [place] [address] o [phone] o Mon-Thur 7:30-7:30, Fri 7:30-4:30 o Babies seen by [facility] providers o Accepting Medicaid . [facility] o Agbuya, MD; Klett, NP; Romgoolam, MD o [address], [place], [place] [address] o [phone] o Mon-Fri 8:30-5:00, Sat 8:30-12:00 o Providers come to see babies at [facility] o Accepting Medicaid o Must have "Meet & Greet" appointment at office prior to delivery . [facility] o McCord,   MD; Wallace, MD; Wood, MD o [address], [place], [place] [address] o [phone] o Mon-Wed 8:00-6:00, Thur-Fri 8:00-5:00, Sat 9:00-12:00 o Providers come to  see babies at [facility] o Does NOT accept Medicaid o Only accepting siblings of current patients . [facility]  o [address], [place], [place]  [address] o [phone]   Fax - [phone] . [facility] o [address], [place], [place]  [address] o [phone]   Fax - [phone]  [place] ([address] & [address]) . [facility] o Cirigliano, DO; Matthews, DO o [address] [place], [place] [address] o [phone] o Mon-Fri 7:00-5:00 o Babies seen by [facility] providers o Does NOT accept Medicaid . [facility] o Briscoe, MD; Howley, PA; Moreira, PA o [address], [place], [place] [address] o [phone] o Mon-Fri 8:00-5:00 o Babies seen by [facility] providers o Accepting Medicaid . [facility] o Boyd, MD; Church, PA; Jones, NP; Osborn, PA o [address], [place], [place] [address] o [phone] o Mon-Fri 8:00-5:00 o Babies seen by providers at [facility] o Accepting Medicaid  [place] ([address]) . [facility] [place] o Wendling, DO o [address] [place], [place] [address] o [phone] o Mon-Fri 8:00-5:00 o Babies seen by [facility] providers o Does NOT accept Medicaid o Limited availability, please call early in hospitalization to schedule follow-up . [facility] o Calderon, PA; Cummings, MD; Dillard, MD; Martin, PA; Olson, MD; VanDeven, PA o [address], [place], [place] [address] o [phone] o Mon-Fri 8:30-5:00, Sat 9:00-12:00 o Babies seen by providers at [facility] o Accepting Medicaid o Please register online then schedule online or call office o [email or web address] . [facility] ([facility] o Hunter, NP; Kumar, MD; Martin Rogers, PA o [address], [place], [place] [address] o [phone] o Mon-Fri 8:00-5:00 o Babies seen by providers at [facility] o Accepting Medicaid . [facility] ([facility] o Campbell, MD; Kristi Fleenor, NP; West, MD o [address], [place], [place] [address] o [phone] o Mon-Fri 8:00-5:30, Sat&Sun by appointment (phones open at 8:30) o Babies seen by [facility] providers o Accepting Medicaid o Must be a first-time baby or sibling of current patient . [facility] [place]  o [address], [place], [place]  [address] o [phone]   Fax - [phone]  [place] ([address]) . [facility] o Brown, PA; Cowen, PA; Rice, MD; Helton, PA; Spry, MD o [address] [place], [place] [address] o [phone] o Mon-Thur 8:00-7:00, Fri 8:00-5:00, Sat 8:00-12:00, Sun 9:00-12:00 o Babies seen by [facility] providers o Accepting Medicaid . [facility] o Coe-Goins, MD; Marshall, MD; Pierre-Louis, MD o [address] [place] [address], [place], [place] [address] o [phone] o Mon-Thur 8:00-5:00 o Babies seen by providers at [facility] o Accepting Medicaid . [facility] o Bratton, MD; Coe-Goins, MD; Hayes, MD; Lewis, MD; List, MD; Lott, MD; Marshall, MD; Moran, MD; O'Neal, MD; Pierre-Louis, MD; Pitonzo, MD; Scholer, MD; Spangle, MD o [address] [place], [place]   [address] o [phone] o Mon-Fri 8:00-5:30, Sat (Oct.-Mar.) 9:00-1:00 o Babies seen by providers at [facility] o Accepting Medicaid o Must fill out new patient packet, available online at [email or web address] . [facility] ([facility] o Friddle, NP; Harris, NP; Kelly, NP; Logan, MD;  Melvin, PA; Poth, MD; Ramadoss, MD; Stanton, NP o [address], [place], [place] [address] o [phone] o Mon-Thur 8:00-5:30, Fri 8:00-5:00 o Babies seen by providers at [facility] o Accepting Medicaid  [place] ([address]) . [facility] o Dixon, PA; Naples, MD; Pickard, MD; Tapia, PA o [address], [place], [place] [address] o [phone] o Mon-Fri 8:00-5:00 o Babies seen by providers at [facility] o Accepting Medicaid   [place] ([address]) . [facility] [place] o Masneri, DO; Meyers, MD; Nelson, PA o [address], [place], [place] [address] o [phone] o Mon-Fri 8:00-5:00 o Babies seen by providers at [facility] o Does NOT accept Medicaid o Limited appointment availability, please call early in hospitalization  . [facility] [place] o Kunedd, DO; McGowen, MD o [address], [place], [place] [address] o [phone] o Mon-Fri 8:00-5:00 o Babies seen by [facility] providers o Does NOT accept Medicaid . [facility] [place] o Cameron, MD; MacDonald, MD; Michaels, PA; Nayak, MD o [address] [place] [address], [place], [place] [address] o [phone] o Mon-Fri 8:00-5:00 o After hours clinic ([address] [place], [place] [address]) [phone] Mon-Fri 5:00-8:00, Sat 12:00-6:00, Sun 10:00-4:00 o Babies seen by [facility] providers o Accepting Medicaid . [facility] [place] o [address], [place], [place]  [address] o [phone]   Fax - [phone]  [place] ([address]) . [facility] [place] o Andy, MD o [address], [place], [place] [address] o [phone] o Mon-Fri 8:00-5:00 o Babies seen by [facility] providers o Does NOT accept Medicaid . [facility] [place] ([facility] [place]) o Eksir, MD o [address], [place], [place]  [address] o [phone] o Mon-Thur 8:00-7:00, Fri 8:00-5:00, Sat 8:00-12:00 o Babies seen by providers at [facility] o Accepting Medicaid - but does not have vaccinations in office (must be received elsewhere) o Limited availability, please call early in hospitalization  [place] ([address]) . [facility]  o Charlene Flemming, MD o [address], [place]  [address] o [phone]  Fax [phone]   

## 2020-06-20 NOTE — Progress Notes (Signed)
   LOW-RISK PREGNANCY OFFICE VISIT  Patient name: Teresa [name] MRN [id number]  Date of birth: Jul 28, 2001 Chief Complaint:   Routine Prenatal Visit  Subjective:   Teresa [name] is a 19 y.o. G1P0 female at [redacted]w[redacted]d with an Estimated Date of Delivery: 06/29/20 being seen today for ongoing management of a low-risk pregnancy aeb has Missed period; Less than [redacted] weeks gestation of pregnancy; Supervision of low-risk pregnancy; and Late prenatal care affecting pregnancy, antepartum on their problem list.  Patient presents today without complaints. Patient endorses fetal movement and denies vaginal concerns including abnormal discharge, leaking of fluid, and bleeding. She also denies abdominal cramping or contractions.  Contractions: Not present. Vag. Bleeding: None.  Movement: Present.  Reviewed past medical,surgical, social, obstetrical and family history as well as problem list, medications and allergies.  Objective   Vitals:   06/20/20 0815  BP: 128/79  Pulse: 90  Temp: 97.9 F (36.6 C)  Weight: 241 lb 6.4 oz (109.5 kg)  Body mass index is 37.81 kg/m.  Total Weight Gain:26 lb 6.4 oz (12 kg)         Physical Examination:   General appearance: Well appearing, and in no distress  Mental status: Alert, oriented to person, place, and time  Skin: Warm & dry  Cardiovascular: Normal heart rate noted  Respiratory: Normal respiratory effort, no distress  Abdomen: Soft, gravid, nontender, AGA with Fundal height of Fundal Height: 39 cm  Pelvic: Cervical exam deferred           Extremities: Edema: None  Fetal Status: Fetal Heart Rate (bpm): 137  Movement: Present   No results found for this or any previous visit (from the past 24 hour(s)).  Assessment & Plan:  Low-risk pregnancy of a 19 y.o., G1P0 at [redacted]w[redacted]d with an Estimated Date of Delivery: 06/29/20   1. Encounter for supervision of low-risk pregnancy in third trimester -Reviewed preparations for infant: Boy no circumcision. Peds list  given -Patient does not desire PP BCM. -Reviewed GBS results; Negative -Discussed IOL and methods:  -Discussed r/b of induction including fetal distress, serial induction, pain, and increased risk of c/s delivery -Discussed induction methods including cervical ripening agents, outpatient foley bulbs, and pitocin.  2. [redacted] weeks gestation of pregnancy -Doing well. -Anticipatory guidance for upcoming appts. -Scheduled for appt for in one week.     Meds: No orders of the defined types were placed in this encounter.  Labs/procedures today:  Lab Orders  No laboratory test(s) ordered today     Reviewed: Term labor symptoms and general obstetric precautions including but not limited to vaginal bleeding, contractions, leaking of fluid and fetal movement were reviewed in detail with the patient.  All questions were answered.  Follow-up: No follow-ups on file.  No orders of the defined types were placed in this encounter.  Cherre Robins MSN, CNM 06/20/2020

## 2020-06-23 DIAGNOSIS — Z03818 Encounter for observation for suspected exposure to other biological agents ruled out: Secondary | ICD-10-CM | POA: Diagnosis not present

## 2020-06-25 ENCOUNTER — Other Ambulatory Visit: Payer: Self-pay | Admitting: Advanced Practice Midwife

## 2020-06-27 ENCOUNTER — Ambulatory Visit (INDEPENDENT_AMBULATORY_CARE_PROVIDER_SITE_OTHER): Payer: BC Managed Care – PPO | Admitting: Certified Nurse Midwife

## 2020-06-27 ENCOUNTER — Other Ambulatory Visit: Payer: Self-pay

## 2020-06-27 VITALS — BP 116/79 | HR 108 | Temp 98.1°F | Wt 244.0 lb

## 2020-06-27 DIAGNOSIS — Z3403 Encounter for supervision of normal first pregnancy, third trimester: Secondary | ICD-10-CM

## 2020-06-27 DIAGNOSIS — Z3A39 39 weeks gestation of pregnancy: Secondary | ICD-10-CM

## 2020-06-27 NOTE — Progress Notes (Signed)
   PRENATAL VISIT NOTE  Subjective:  Teresa [name] is a 20 y.o. G1P0 at [redacted]w[redacted]d being seen today for ongoing prenatal care.  She is currently monitored for the following issues for this low-risk pregnancy and has Supervision of low-risk pregnancy and Late prenatal care affecting pregnancy, antepartum on their problem list.  Patient reports increased pressure and occasional BH.  Contractions: Not present. Vag. Bleeding: None.  Movement: Present. Denies leaking of fluid.   The following portions of the patient's history were reviewed and updated as appropriate: allergies, current medications, past family history, past medical history, past social history, past surgical history and problem list.   Objective:   Vitals:   06/27/20 0838  BP: 116/79  Pulse: (!) 108  Temp: 98.1 F (36.7 C)  Weight: 244 lb (110.7 kg)    Fetal Status: Fetal Heart Rate (bpm): 135 Fundal Height: 40 cm Movement: Present     General:  Alert, oriented and cooperative. Patient is in no acute distress.  Skin: Skin is warm and dry. No rash noted.   Cardiovascular: Normal heart rate noted  Respiratory: Normal respiratory effort, no problems with respiration noted  Abdomen: Soft, gravid, appropriate for gestational age.  Pain/Pressure: Present     Pelvic: Cervical exam deferred        Extremities: Normal range of motion.  Edema: None  Mental Status: Normal mood and affect. Normal behavior. Normal judgment and thought content.   Assessment and Plan:  Pregnancy: G1P0 at [redacted]w[redacted]d 1. Encounter for supervision of normal first pregnancy in third trimester - Pt doing well, discussed expected sensation of contractions and progression of early labor  2. [redacted] weeks gestation of pregnancy - Has IOL scheduled for 07/05/20  Term labor symptoms and general obstetric precautions including but not limited to vaginal bleeding, contractions, leaking of fluid and fetal movement were reviewed in detail with the patient. Please refer  to After Visit Summary for other counseling recommendations.   Future Appointments  Date Time Provider Department Center  07/04/2020  8:00 AM Gerrit Heck, CNM [facility] None  07/05/2020 12:00 AM [facility] SCHED ROOM [facility] None   Edd Arbour, CNM, MSN, [facility] 06/27/20 10:02 AM

## 2020-06-30 ENCOUNTER — Inpatient Hospital Stay (EMERGENCY_DEPARTMENT_HOSPITAL)
Admission: AD | Admit: 2020-06-30 | Discharge: 2020-06-30 | Disposition: A | Discharge status: Home / Self Care | Payer: BC Managed Care – PPO | Source: Home / Self Care | Attending: Family Medicine | Admitting: Family Medicine

## 2020-06-30 ENCOUNTER — Encounter (HOSPITAL_COMMUNITY): Payer: Self-pay | Admitting: Family Medicine

## 2020-06-30 ENCOUNTER — Encounter (HOSPITAL_COMMUNITY): Payer: Self-pay | Admitting: *Deleted

## 2020-06-30 ENCOUNTER — Telehealth (HOSPITAL_COMMUNITY): Payer: Self-pay | Admitting: *Deleted

## 2020-06-30 DIAGNOSIS — Z3689 Encounter for other specified antenatal screening: Secondary | ICD-10-CM

## 2020-06-30 DIAGNOSIS — O368130 Decreased fetal movements, third trimester, not applicable or unspecified: Secondary | ICD-10-CM

## 2020-06-30 DIAGNOSIS — Z3A40 40 weeks gestation of pregnancy: Secondary | ICD-10-CM | POA: Diagnosis not present

## 2020-06-30 DIAGNOSIS — Z20822 Contact with and (suspected) exposure to covid-19: Secondary | ICD-10-CM | POA: Diagnosis not present

## 2020-06-30 DIAGNOSIS — O99214 Obesity complicating childbirth: Secondary | ICD-10-CM | POA: Diagnosis not present

## 2020-06-30 DIAGNOSIS — O4212 Full-term premature rupture of membranes, onset of labor more than 24 hours following rupture: Secondary | ICD-10-CM | POA: Diagnosis not present

## 2020-06-30 DIAGNOSIS — O26893 Other specified pregnancy related conditions, third trimester: Secondary | ICD-10-CM | POA: Diagnosis not present

## 2020-06-30 DIAGNOSIS — E669 Obesity, unspecified: Secondary | ICD-10-CM | POA: Diagnosis not present

## 2020-06-30 DIAGNOSIS — O9902 Anemia complicating childbirth: Secondary | ICD-10-CM | POA: Diagnosis not present

## 2020-06-30 DIAGNOSIS — O480 Post-term pregnancy: Secondary | ICD-10-CM | POA: Diagnosis not present

## 2020-06-30 DIAGNOSIS — D649 Anemia, unspecified: Secondary | ICD-10-CM | POA: Diagnosis not present

## 2020-06-30 DIAGNOSIS — Z23 Encounter for immunization: Secondary | ICD-10-CM | POA: Diagnosis not present

## 2020-06-30 DIAGNOSIS — O4292 Full-term premature rupture of membranes, unspecified as to length of time between rupture and onset of labor: Secondary | ICD-10-CM | POA: Diagnosis not present

## 2020-06-30 NOTE — MAU Provider Note (Signed)
History   [id number]   Chief Complaint  Patient presents with  . Decreased Fetal Movement    HPI Teresa [name] is a 19 y.o. female  G1P0 here with report of decreased fetal movement since this morning.  Reports feeling the baby move approximately 6 times today prior to coming to MAU.  Denies vaginal bleeding or leaking of fluid.  Denies contractions.  Patient's last menstrual period was 09/23/2019.  OB History  Gravida Para Term Preterm AB Living  1         0  SAB TAB Ectopic Multiple Live Births               # Outcome Date GA Lbr Len/2nd Weight Sex Delivery Anes PTL Lv  1 Current             Past Medical History:  Diagnosis Date  . Family history of adverse reaction to anesthesia    lidocaine does not work on her mother  . Medical history non-contributory     Family History  Problem Relation Age of Onset  . Diabetes Father     Social History   Socioeconomic History  . Marital status: Single    Spouse name: Not on file  . Number of children: Not on file  . Years of education: Not on file  . Highest education level: Associate degree: occupational, Scientist, product/process development, or vocational program  Occupational History    Comment: Car Dealership  Tobacco Use  . Smoking status: Never Smoker  . Smokeless tobacco: Never Used  Vaping Use  . Vaping Use: Never used  Substance and Sexual Activity  . Alcohol use: Not Currently  . Drug use: Never  . Sexual activity: Yes    Birth control/protection: None  Other Topics Concern  . Not on file  Social History Narrative   Late to care   Social Determinants of Health   Financial Resource Strain:   . Difficulty of Paying Living Expenses: Not on file  Food Insecurity:   . Worried About Programme researcher, broadcasting/film/video in the Last Year: Not on file  . Ran Out of Food in the Last Year: Not on file  Transportation Needs:   . Lack of Transportation (Medical): Not on file  . Lack of Transportation (Non-Medical): Not on file  Physical Activity:    . Days of Exercise per Week: Not on file  . Minutes of Exercise per Session: Not on file  Stress:   . Feeling of Stress : Not on file  Social Connections:   . Frequency of Communication with Friends and Family: Not on file  . Frequency of Social Gatherings with Friends and Family: Not on file  . Attends Religious Services: Not on file  . Active Member of Clubs or Organizations: Not on file  . Attends Banker Meetings: Not on file  . Marital Status: Not on file    No Known Allergies  No current facility-administered medications on file prior to encounter.   Current Outpatient Medications on File Prior to Encounter  Medication Sig Dispense Refill  . Prenatal Vit w/Fe-Methylfol-FA (PNV PO) Take by mouth.       Review of Systems  Constitutional: Negative.   Gastrointestinal: Negative.   Genitourinary: Negative.      Physical Exam   Vitals:   06/30/20 1607 06/30/20 1612 06/30/20 1620 06/30/20 1640  BP:  128/76    Pulse:  95    Resp:  18    Temp:  98.8 F (  37.1 C)    TempSrc:  Oral    SpO2:  100% 100% 100%  Weight: 110.4 kg       Physical Exam Vitals and nursing note reviewed.  Constitutional:      General: She is not in acute distress.    Appearance: Normal appearance. She is well-developed. She is not diaphoretic.  HENT:     Head: Normocephalic and atraumatic.  Eyes:     General: No scleral icterus.       Right eye: No discharge.        Left eye: No discharge.     Conjunctiva/sclera: Conjunctivae normal.  Cardiovascular:     Rate and Rhythm: Normal rate and regular rhythm.     Heart sounds: Normal heart sounds. No murmur heard.   Pulmonary:     Effort: Pulmonary effort is normal. No respiratory distress.     Breath sounds: Normal breath sounds. No wheezing.  Abdominal:     Palpations: Abdomen is soft.     Tenderness: There is no abdominal tenderness.     Comments: Gravid uterus  Musculoskeletal:     Cervical back: Normal range of motion.   Skin:    General: Skin is warm and dry.  Neurological:     Mental Status: She is alert and oriented to person, place, and time.  Psychiatric:        Mood and Affect: Mood normal.        Behavior: Behavior normal.        Thought Content: Thought content normal.        Judgment: Judgment normal.    Fetal Tracing:  Baseline: 140 Variability: moderate Accelerations: 15x15 Decelerations: none  Toco: x2   MAU Course  Procedures   MDM Patient presents with decreased fetal movement. Felt baby move 6 times today. Reactive fetal tracing. Reports feeling baby move 4 times in 20 minutes while being monitored. Reviewed patient & tracing with Dr. Adrian Blackwater. Ok to discharge home. Patient has appointment in the office later this week.   Assessment and Plan   1. Decreased fetal movements in third trimester, single or unspecified fetus   2. NST (non-stress test) reactive   3. [redacted] weeks gestation of pregnancy    -Reviewed kick counts & reasons to return to MAU   Judeth Horn, NP 06/30/2020 10:05 PM

## 2020-06-30 NOTE — MAU Note (Signed)
Reports decrease in fetal movement.  Moving, just not as much as usual.  Denies pain, no bleeding or leaking.

## 2020-06-30 NOTE — Discharge Instructions (Signed)
Fetal Movement Counts Patient Name: ________________________________________________ Patient Due Date: ____________________ What is a fetal movement count?  A fetal movement count is the number of times that you feel your baby move during a certain amount of time. This may also be called a fetal kick count. A fetal movement count is recommended for every pregnant woman. You may be asked to start counting fetal movements as early as week 28 of your pregnancy. Pay attention to when your baby is most active. You may notice your baby's sleep and wake cycles. You may also notice things that make your baby move more. You should do a fetal movement count:  When your baby is normally most active.  At the same time each day. A good time to count movements is while you are resting, after having something to eat and drink. How do I count fetal movements? 1. Find a quiet, comfortable area. Sit, or lie down on your side. 2. Write down the date, the start time and stop time, and the number of movements that you felt between those two times. Take this information with you to your health care visits. 3. Write down your start time when you feel the first movement. 4. Count kicks, flutters, swishes, rolls, and jabs. You should feel at least 10 movements. 5. You may stop counting after you have felt 10 movements, or if you have been counting for 2 hours. Write down the stop time. 6. If you do not feel 10 movements in 2 hours, contact your health care provider for further instructions. Your health care provider may want to do additional tests to assess your baby's well-being. Contact a health care provider if:  You feel fewer than 10 movements in 2 hours.  Your baby is not moving like he or she usually does. Date: ____________ Start time: ____________ Stop time: ____________ Movements: ____________ Date: ____________ Start time: ____________ Stop time: ____________ Movements: ____________ Date: ____________  Start time: ____________ Stop time: ____________ Movements: ____________ Date: ____________ Start time: ____________ Stop time: ____________ Movements: ____________ Date: ____________ Start time: ____________ Stop time: ____________ Movements: ____________ Date: ____________ Start time: ____________ Stop time: ____________ Movements: ____________ Date: ____________ Start time: ____________ Stop time: ____________ Movements: ____________ Date: ____________ Start time: ____________ Stop time: ____________ Movements: ____________ Date: ____________ Start time: ____________ Stop time: ____________ Movements: ____________ This information is not intended to replace advice given to you by your health care provider. Make sure you discuss any questions you have with your health care provider. Document Revised: 04/05/2019 Document Reviewed: 04/05/2019 Elsevier Patient Education  2020 Elsevier Inc.  

## 2020-06-30 NOTE — Telephone Encounter (Signed)
Preadmission screen C/o decreased fetal movement.  Instructed to come to MAU for evaluation.  Verbalized understanding.

## 2020-07-02 ENCOUNTER — Encounter (HOSPITAL_COMMUNITY): Payer: Self-pay | Admitting: Obstetrics and Gynecology

## 2020-07-02 ENCOUNTER — Other Ambulatory Visit: Payer: Self-pay

## 2020-07-02 ENCOUNTER — Inpatient Hospital Stay (HOSPITAL_COMMUNITY)
Admission: AD | Admit: 2020-07-02 | Discharge: 2020-07-04 | DRG: 807 | Disposition: A | Discharge status: Home / Self Care | Payer: BC Managed Care – PPO | Attending: Obstetrics & Gynecology | Admitting: Obstetrics & Gynecology

## 2020-07-02 DIAGNOSIS — Z3A40 40 weeks gestation of pregnancy: Secondary | ICD-10-CM

## 2020-07-02 DIAGNOSIS — Z20822 Contact with and (suspected) exposure to covid-19: Secondary | ICD-10-CM | POA: Diagnosis not present

## 2020-07-02 DIAGNOSIS — O0930 Supervision of pregnancy with insufficient antenatal care, unspecified trimester: Principal | ICD-10-CM

## 2020-07-02 DIAGNOSIS — O4290 Premature rupture of membranes, unspecified as to length of time between rupture and onset of labor, unspecified weeks of gestation: Secondary | ICD-10-CM | POA: Diagnosis present

## 2020-07-02 DIAGNOSIS — Z3493 Encounter for supervision of normal pregnancy, unspecified, third trimester: Secondary | ICD-10-CM

## 2020-07-02 DIAGNOSIS — E669 Obesity, unspecified: Secondary | ICD-10-CM | POA: Diagnosis not present

## 2020-07-02 DIAGNOSIS — O4292 Full-term premature rupture of membranes, unspecified as to length of time between rupture and onset of labor: Secondary | ICD-10-CM | POA: Diagnosis not present

## 2020-07-02 DIAGNOSIS — O480 Post-term pregnancy: Secondary | ICD-10-CM | POA: Diagnosis present

## 2020-07-02 DIAGNOSIS — O4212 Full-term premature rupture of membranes, onset of labor more than 24 hours following rupture: Secondary | ICD-10-CM | POA: Diagnosis not present

## 2020-07-02 DIAGNOSIS — O26893 Other specified pregnancy related conditions, third trimester: Secondary | ICD-10-CM | POA: Diagnosis not present

## 2020-07-02 DIAGNOSIS — O99214 Obesity complicating childbirth: Secondary | ICD-10-CM | POA: Diagnosis present

## 2020-07-02 DIAGNOSIS — Z23 Encounter for immunization: Secondary | ICD-10-CM | POA: Diagnosis not present

## 2020-07-02 LAB — CBC
HCT: 32.4 % — ABNORMAL LOW (ref 36.0–46.0)
HCT: 30 % — ABNORMAL LOW (ref 36.0–46.0)
Hemoglobin: 10.7 g/dL — ABNORMAL LOW (ref 12.0–15.0)
Hemoglobin: 9.8 g/dL — ABNORMAL LOW (ref 12.0–15.0)
MCH: 30 pg (ref 26.0–34.0)
MCH: 29.7 pg (ref 26.0–34.0)
MCHC: 33 g/dL (ref 30.0–36.0)
MCHC: 32.7 g/dL (ref 30.0–36.0)
MCV: 90.8 fL (ref 80.0–100.0)
MCV: 90.9 fL (ref 80.0–100.0)
Platelets: 231 10*3/uL (ref 150–400)
Platelets: 219 10*3/uL (ref 150–400)
RBC: 3.57 MIL/uL — ABNORMAL LOW (ref 3.87–5.11)
RBC: 3.3 MIL/uL — ABNORMAL LOW (ref 3.87–5.11)
RDW: 14.1 % (ref 11.5–15.5)
RDW: 14.4 % (ref 11.5–15.5)
WBC: 11 10*3/uL — ABNORMAL HIGH (ref 4.0–10.5)
WBC: 10.7 10*3/uL — ABNORMAL HIGH (ref 4.0–10.5)
nRBC: 0 % (ref 0.0–0.2)
nRBC: 0 % (ref 0.0–0.2)

## 2020-07-02 LAB — RESPIRATORY PANEL BY RT PCR (FLU A&B, COVID)
Influenza A by PCR: NEGATIVE
Influenza B by PCR: NEGATIVE
SARS Coronavirus 2 by RT PCR: NEGATIVE

## 2020-07-02 LAB — TYPE AND SCREEN
ABO/RH(D): A POS
Antibody Screen: NEGATIVE

## 2020-07-02 LAB — POCT FERN TEST: POCT Fern Test: POSITIVE

## 2020-07-02 MED ORDER — LACTATED RINGERS IV SOLN: INTRAVENOUS | Status: DC

## 2020-07-02 MED ORDER — FENTANYL CITRATE (PF) 100 MCG/2ML IJ SOLN
100.0000 ug | INTRAMUSCULAR | Status: DC | PRN
  Administered 2020-07-03: 100 ug via INTRAVENOUS
  Filled 2020-07-02 (×2): qty 2

## 2020-07-02 MED ORDER — TERBUTALINE SULFATE 1 MG/ML IJ SOLN: 0.2500 mg | Freq: Once | INTRAMUSCULAR | Status: DC | PRN

## 2020-07-02 MED ORDER — SOD CITRATE-CITRIC ACID 500-334 MG/5ML PO SOLN: 30.0000 mL | ORAL | Status: DC | PRN

## 2020-07-02 MED ORDER — MISOPROSTOL 25 MCG QUARTER TABLET
25.0000 ug | ORAL_TABLET | ORAL | Status: DC | PRN
  Administered 2020-07-02: 25 ug via VAGINAL
  Filled 2020-07-02: qty 1

## 2020-07-02 MED ORDER — LACTATED RINGERS IV SOLN: 500.0000 mL | INTRAVENOUS | Status: DC | PRN

## 2020-07-02 MED ORDER — ONDANSETRON HCL 4 MG/2ML IJ SOLN: 4.0000 mg | Freq: Four times a day (QID) | INTRAMUSCULAR | Status: DC | PRN

## 2020-07-02 MED ORDER — FENTANYL CITRATE (PF) 100 MCG/2ML IJ SOLN: 50.0000 ug | INTRAMUSCULAR | Status: DC | PRN

## 2020-07-02 MED ORDER — OXYTOCIN-SODIUM CHLORIDE 30-0.9 UT/500ML-% IV SOLN
1.0000 m[IU]/min | INTRAVENOUS | Status: DC
  Administered 2020-07-02: 2 m[IU]/min via INTRAVENOUS
  Filled 2020-07-02: qty 500

## 2020-07-02 MED ORDER — OXYTOCIN BOLUS FROM INFUSION
333.0000 mL | Freq: Once | INTRAVENOUS | Status: AC
  Administered 2020-07-03: 333 mL via INTRAVENOUS

## 2020-07-02 MED ORDER — OXYTOCIN-SODIUM CHLORIDE 30-0.9 UT/500ML-% IV SOLN: 2.5000 [IU]/h | INTRAVENOUS | Status: DC

## 2020-07-02 MED ORDER — ACETAMINOPHEN 325 MG PO TABS: 650.0000 mg | ORAL_TABLET | ORAL | Status: DC | PRN

## 2020-07-02 MED ORDER — LIDOCAINE HCL (PF) 1 % IJ SOLN: 30.0000 mL | INTRAMUSCULAR | Status: DC | PRN

## 2020-07-02 MED ORDER — OXYCODONE-ACETAMINOPHEN 5-325 MG PO TABS: 2.0000 | ORAL_TABLET | ORAL | Status: DC | PRN

## 2020-07-02 MED ORDER — ONDANSETRON HCL 4 MG/2ML IJ SOLN
4.0000 mg | Freq: Four times a day (QID) | INTRAMUSCULAR | Status: DC | PRN
  Administered 2020-07-03: 4 mg via INTRAVENOUS
  Filled 2020-07-02: qty 2

## 2020-07-02 MED ORDER — OXYTOCIN BOLUS FROM INFUSION: 333.0000 mL | Freq: Once | INTRAVENOUS | Status: DC

## 2020-07-02 MED ORDER — OXYCODONE-ACETAMINOPHEN 5-325 MG PO TABS: 1.0000 | ORAL_TABLET | ORAL | Status: DC | PRN

## 2020-07-02 NOTE — Progress Notes (Signed)
Report given to J. Talmadge Coventry, RN, L&D Press photographer.  Room assignment received, room 213.

## 2020-07-02 NOTE — Progress Notes (Signed)
Labor Progress Note Teresa [name] is a 19 y.o. G1P0 at [redacted]w[redacted]d presented for PROM S: Feeling stronger contractions  O:  BP 119/82   Pulse 90   Temp 97.6 F (36.4 C) (Oral)   Resp 16   Ht 5\' 7"  (1.702 m)   Wt 110 kg   LMP 09/23/2019   SpO2 99%   BMI 38.00 kg/m  EFM: 130/mod/accels present no decels Toco every 1-3 min  CVE: Dilation: 2.5 Effacement (%): 70 Cervical Position: Posterior Station: -3 Presentation: Vertex Exam by:: [email or web address], RNC   A&P: 19 y.o. G1P0 [redacted]w[redacted]d  #Labor/PROM: Progressing well. S/p cytotec x1 and on pitocin #Pain: planning on no epidural #FWB: cat 1 #GBS negative   [redacted]w[redacted]d, MD 11:06 PM

## 2020-07-02 NOTE — H&P (Addendum)
OBSTETRIC ADMISSION HISTORY AND PHYSICAL  Teresa [name] is a 19 y.o. female G1P0 with IUP at [redacted]w[redacted]d by ultrasound presenting for leakage of fluid and possible rupture of membrane last night. She reports +FMs, no VB, no blurry vision, headaches or peripheral edema, and RUQ pain.  She plans on breast and bottle  feeding. She request natrual family planning for birth control. She received her prenatal care at [facility]   Dating: By ultrasound --->  Estimated Date of Delivery: IOL for 07/05/20  Sono: 05/02/2020 was within normal limits. Repeat ultrasound was completed on 9/30 as she presented late for prenatal care. Fetal growth and amniotic fluid level appeared appropriate for her gestational age  @35w  4d, CWD, normal anatomy, cephalic presentation, 2441g, EFW    Prenatal History/Complications: Late prenatal care. Had ultrasound prenatal care early pregnancy at 25%  Past Medical History: Past Medical History:  Diagnosis Date   Family history of adverse reaction to anesthesia    lidocaine does not work on her mother   Medical history non-contributory     Past Surgical History: Past Surgical History:  Procedure Laterality Date   NO PAST SURGERIES      Obstetrical History: OB History     Gravida  1   Para      Term      Preterm      AB      Living  0      SAB      TAB      Ectopic      Multiple      Live Births              Social History Social History   Socioeconomic History   Marital status: Single    Spouse name: Not on file   Number of children: Not on file   Years of education: Not on file   Highest education level: Associate degree: occupational, [place], or vocational program  Occupational History    Comment: Scientist, product/process development  Tobacco Use   Smoking status: Never Smoker   Smokeless tobacco: Never Used  Programme researcher, broadcasting/film/video Use: Never used  Substance and Sexual Activity   Alcohol use: Not Currently   Drug use: Never   Sexual activity:  Not Currently    Birth control/protection: None  Other Topics Concern   Not on file  Social History Narrative   Late to care   Social Determinants of Health   Financial Resource Strain:    Difficulty of Paying Living Expenses: Not on file  Food Insecurity:    Worried About Building services engineer in the Last Year: Not on file   Programme researcher, broadcasting/film/video of Food in the Last Year: Not on file  Transportation Needs:    Lack of Transportation (Medical): Not on file   Lack of Transportation (Non-Medical): Not on file  Physical Activity:    Days of Exercise per Week: Not on file   Minutes of Exercise per Session: Not on file  Stress:    Feeling of Stress : Not on file  Social Connections:    Frequency of Communication with Friends and Family: Not on file   Frequency of Social Gatherings with Friends and Family: Not on file   Attends Religious Services: Not on file   Active Member of Clubs or Organizations: Not on file   Attends [employer] Financial Meetings: Not on file   Marital Status: Not on file    Family History: Family History  Problem Relation Age of Onset   Diabetes Father     Allergies: No Known Allergies  Medications Prior to Admission  Medication Sig Dispense Refill Last Dose   Prenatal Vit w/Fe-Methylfol-FA (PNV PO) Take by mouth.   07/01/2020 at Unknown time     Review of Systems   All systems reviewed and negative except as stated in HPI  Blood pressure 128/78, pulse (!) 114, temperature 98.4 F (36.9 C), temperature source Oral, resp. rate 20, height 5\' 7"  (1.702 m), weight 110 kg, last menstrual period 09/23/2019, SpO2 99 %. General appearance: alert Lungs: clear to auscultation bilaterally Heart: regular rate and rhythm Abdomen: soft, non-tender; bowel sounds normal Pelvic: firm uterus  Extremities: Homans sign is negative, no sign of DVT Presentation: cephalic Fetal monitoringBaseline: 150-155 bpm, Variability: Good {> 6 bpm) and Accelerations: Reactive Uterine  activityNone Dilation: 1.5 Effacement (%): 70, 80 Station: -3 Exam by:: [email or web address], RNC   Prenatal labs: ABO, Rh: --/--/PENDING (11/03 1412) Antibody: PENDING (11/03 1412) Rubella: 1.95 (08/31 0902) RPR: Non Reactive (08/31 0902)  HBsAg: Negative (08/31 0902)  HIV: Non Reactive (08/31 0902)  GBS: Negative/-- (10/07 0913)  2 hr Glucola passed Genetic screening  Active  Anatomy 05-23-2000 within normal limits   Prenatal Transfer Tool  Maternal Diabetes: No Genetic Screening: None Maternal Ultrasounds/Referrals: Normal Fetal Ultrasounds or other Referrals:  None Maternal Substance Abuse:  No Significant Maternal Medications:  None Significant Maternal Lab Results: None  Results for orders placed or performed during the hospital encounter of 07/02/20 (from the past 24 hour(s))  Fern Test   Collection Time: 07/02/20  1:42 PM  Result Value Ref Range   POCT Fern Test Positive = ruptured amniotic membanes   Respiratory Panel by RT PCR (Flu A&B, Covid) - Nasopharyngeal Swab   Collection Time: 07/02/20  1:56 PM   Specimen: Nasopharyngeal Swab  Result Value Ref Range   SARS Coronavirus 2 by RT PCR NEGATIVE NEGATIVE   Influenza A by PCR NEGATIVE NEGATIVE   Influenza B by PCR NEGATIVE NEGATIVE  Type and screen [facility]   Collection Time: 07/02/20  2:12 PM  Result Value Ref Range   ABO/RH(D) PENDING    Antibody Screen PENDING    Sample Expiration      07/05/2020,2359 Performed at [facility], [address] [place], [place]   CBC   Collection Time: 07/02/20  2:18 PM  Result Value Ref Range   WBC 11.0 (H) 4.0 - 10.5 K/uL   RBC 3.57 (L) 3.87 - 5.11 MIL/uL   Hemoglobin 10.7 (L) 12.0 - 15.0 g/dL   HCT 13/03/21 (L) 36 - 46 %   MCV 90.8 80.0 - 100.0 fL   MCH 30.0 26.0 - 34.0 pg   MCHC 33.0 30.0 - 36.0 g/dL   RDW 88.8 28.0 - 03.4 %   Platelets 231 150 - 400 K/uL   nRBC 0.0 0.0 - 0.2 %    Patient Active Problem List   Diagnosis Date Noted    Premature rupture of membranes 07/02/2020   Post-dates pregnancy 07/02/2020   Late prenatal care affecting pregnancy, antepartum 04/29/2020   Supervision of low-risk pregnancy 12/12/2019    Assessment/Plan:  Teresa [name] is a 19 y.o. G1P0 at [redacted]w[redacted]d here for with IUP at [redacted]w[redacted]d by ultrasound presenting for leakage of fluid and possible rupture of membrane last night--fern test positive. Start misoprostol for cervical ripening.   #Labor: 1.5/ 70;80/ -3. Start induction given PROM without labor  progression.  #Pain: None  #FWB: Cat 1 #ID:  GBS negative, negative RPR, HCV  #MOF: Breast and bottle #MOC: Natural family planning #Circ:  declined  Sul Jonni Sanger, Medical Student  07/02/2020, 3:14 PM  OB FELLOW HISTORY AND PHYSICAL ATTESTATION  I have seen and examined this patient; I agree with above documentation in the medical student's note.   Marcy Siren, D.O. OB Fellow  07/03/2020, 2:00 PM

## 2020-07-02 NOTE — Progress Notes (Signed)
Patient ID: Teresa [name], female   DOB: 10-13-2000, 19 y.o.   MRN: [id number]  S/p cytotec x 1 dose, then Pitocin started at 1900; not feeling ctx really  BP 119/82, P 90 FHR 135-140s, +accels, no decels, Cat 1 Ctx irreg, mild with Pit at 61mu/min Cx was 2-3/70/vtx -3 at start of Pit  IUP@40 .3wks IOL after PROM GBS neg  Continue to titrate Pitocin to achieve active labor Anticipate vag del  Teresa [name] CNM 07/02/2020

## 2020-07-02 NOTE — MAU Note (Signed)
Presents with c/o LOF since last night, reports fluid is clear.  Denies VB.  Endorses +FM.   Scheduled for IOL on 07/05/2020.

## 2020-07-03 ENCOUNTER — Encounter (HOSPITAL_COMMUNITY): Payer: Self-pay | Admitting: Family Medicine

## 2020-07-03 ENCOUNTER — Inpatient Hospital Stay (HOSPITAL_COMMUNITY): Payer: BC Managed Care – PPO | Admitting: Anesthesiology

## 2020-07-03 ENCOUNTER — Other Ambulatory Visit (HOSPITAL_COMMUNITY): Payer: BC Managed Care – PPO

## 2020-07-03 DIAGNOSIS — O4212 Full-term premature rupture of membranes, onset of labor more than 24 hours following rupture: Secondary | ICD-10-CM | POA: Diagnosis not present

## 2020-07-03 DIAGNOSIS — Z3A40 40 weeks gestation of pregnancy: Secondary | ICD-10-CM | POA: Diagnosis not present

## 2020-07-03 DIAGNOSIS — O480 Post-term pregnancy: Secondary | ICD-10-CM | POA: Diagnosis not present

## 2020-07-03 LAB — SYPHILIS: RPR W/REFLEX TO RPR TITER AND TREPONEMAL ANTIBODIES, TRADITIONAL SCREENING AND DIAGNOSIS ALGORITHM: RPR Ser Ql: NONREACTIVE

## 2020-07-03 MED ORDER — DIPHENHYDRAMINE HCL 25 MG PO CAPS: 25.0000 mg | ORAL_CAPSULE | Freq: Four times a day (QID) | ORAL | Status: DC | PRN

## 2020-07-03 MED ORDER — SIMETHICONE 80 MG PO CHEW: 80.0000 mg | CHEWABLE_TABLET | ORAL | Status: DC | PRN

## 2020-07-03 MED ORDER — EPHEDRINE 5 MG/ML INJ: 10.0000 mg | INTRAVENOUS | Status: DC | PRN

## 2020-07-03 MED ORDER — COCONUT OIL OIL: 1.0000 "application " | TOPICAL_OIL | Status: DC | PRN

## 2020-07-03 MED ORDER — ONDANSETRON HCL 4 MG/2ML IJ SOLN: 4.0000 mg | INTRAMUSCULAR | Status: DC | PRN

## 2020-07-03 MED ORDER — PHENYLEPHRINE 40 MCG/ML (10ML) SYRINGE FOR IV PUSH (FOR BLOOD PRESSURE SUPPORT): 80.0000 ug | PREFILLED_SYRINGE | INTRAVENOUS | Status: DC | PRN

## 2020-07-03 MED ORDER — TETANUS-DIPHTH-ACELL PERTUSSIS 5-2.5-18.5 LF-MCG/0.5 IM SUSY
0.5000 mL | PREFILLED_SYRINGE | Freq: Once | INTRAMUSCULAR | Status: AC
  Administered 2020-07-03: 0.5 mL via INTRAMUSCULAR
  Filled 2020-07-03: qty 0.5

## 2020-07-03 MED ORDER — PHENYLEPHRINE 40 MCG/ML (10ML) SYRINGE FOR IV PUSH (FOR BLOOD PRESSURE SUPPORT)
80.0000 ug | PREFILLED_SYRINGE | INTRAVENOUS | Status: DC | PRN
  Filled 2020-07-03: qty 10

## 2020-07-03 MED ORDER — SODIUM CHLORIDE (PF) 0.9 % IJ SOLN
INTRAMUSCULAR | Status: DC | PRN
  Administered 2020-07-03: 12 mL/h via EPIDURAL

## 2020-07-03 MED ORDER — WITCH HAZEL-GLYCERIN EX PADS: 1.0000 "application " | MEDICATED_PAD | CUTANEOUS | Status: DC | PRN

## 2020-07-03 MED ORDER — SENNOSIDES-DOCUSATE SODIUM 8.6-50 MG PO TABS
2.0000 | ORAL_TABLET | ORAL | Status: DC
  Administered 2020-07-03: 2 via ORAL
  Filled 2020-07-03: qty 2

## 2020-07-03 MED ORDER — BENZOCAINE-MENTHOL 20-0.5 % EX AERO
1.0000 "application " | INHALATION_SPRAY | CUTANEOUS | Status: DC | PRN
  Administered 2020-07-03: 1 via TOPICAL
  Filled 2020-07-03: qty 56

## 2020-07-03 MED ORDER — DIBUCAINE (PERIANAL) 1 % EX OINT: 1.0000 "application " | TOPICAL_OINTMENT | CUTANEOUS | Status: DC | PRN

## 2020-07-03 MED ORDER — ACETAMINOPHEN 325 MG PO TABS
650.0000 mg | ORAL_TABLET | ORAL | Status: DC
  Administered 2020-07-04 (×2): 650 mg via ORAL
  Filled 2020-07-03 (×5): qty 2

## 2020-07-03 MED ORDER — LIDOCAINE HCL (PF) 1 % IJ SOLN
INTRAMUSCULAR | Status: DC | PRN
  Administered 2020-07-03 (×2): 4 mL via EPIDURAL

## 2020-07-03 MED ORDER — IBUPROFEN 600 MG PO TABS
600.0000 mg | ORAL_TABLET | Freq: Four times a day (QID) | ORAL | Status: DC
  Administered 2020-07-03 – 2020-07-04 (×5): 600 mg via ORAL
  Filled 2020-07-03 (×5): qty 1

## 2020-07-03 MED ORDER — LACTATED RINGERS IV SOLN: 500.0000 mL | Freq: Once | INTRAVENOUS | Status: DC

## 2020-07-03 MED ORDER — ONDANSETRON HCL 4 MG PO TABS: 4.0000 mg | ORAL_TABLET | ORAL | Status: DC | PRN

## 2020-07-03 MED ORDER — DIPHENHYDRAMINE HCL 50 MG/ML IJ SOLN: 12.5000 mg | INTRAMUSCULAR | Status: DC | PRN

## 2020-07-03 MED ORDER — FENTANYL-BUPIVACAINE-NACL 0.5-0.125-0.9 MG/250ML-% EP SOLN
EPIDURAL | Status: AC
  Filled 2020-07-03: qty 250

## 2020-07-03 MED ORDER — FENTANYL-BUPIVACAINE-NACL 0.5-0.125-0.9 MG/250ML-% EP SOLN: 12.0000 mL/h | EPIDURAL | Status: DC | PRN

## 2020-07-03 MED ORDER — PRENATAL MULTIVITAMIN CH
1.0000 | ORAL_TABLET | Freq: Every day | ORAL | Status: DC
  Administered 2020-07-03 – 2020-07-04 (×2): 1 via ORAL
  Filled 2020-07-03 (×2): qty 1

## 2020-07-03 MED ORDER — MEASLES, MUMPS & RUBELLA VAC IJ SOLR: 0.5000 mL | Freq: Once | INTRAMUSCULAR | Status: DC

## 2020-07-03 NOTE — Progress Notes (Signed)
Patient ID: Teresa [name], female   DOB: 22-Aug-2001, 19 y.o.   MRN: [id number]  Up to bathroom and then back to bed; feeling ctx more; just had exam and desires IV pain meds  BP 127/71, P 84 FHR 120s, +accels, no decels Ctx irreg 2-4 mins with Pit at 81mu/min Cx per RN 3/70/vtx -2, soft  IUP@40 .4wks IOL process after PROM GBS neg  Will give dose of Fentanyl Plan to titrate Pitocin to achieve active labor  Arabella Merles CNM 07/03/2020 3:10 AM

## 2020-07-03 NOTE — Lactation Note (Signed)
This note was copied from a baby's chart. Lactation Consultation Note  Patient Name: Teresa [name] Date: 07/03/2020 Reason for consult: Initial assessment;Mother's request;Difficult latch;Primapara;1st time breastfeeding;Term  Infant is 40 weeks 8 hours old. Mom taught hand expression by RN. Infant fed at 1230 for 15 minutes and 1530 for 10 minutes. LC found infant swaddled and instructed Mom to put infant STS.   Infant had 4 stools and no urine since birth.   Prior to latching, LC used heat and breast massage noting some drops of colostrum at the nipple. Mom's breast appear flat but will erect with stimulation and a nipple role.   Infant has a top lip tie and a recessed chin. During oral exam, infant began to cue. LC assisted Mom latching on the right side in football. LC assisted Mom getting a deep latch pulling the chin down and ensuring the lips are flanged. Infant latched with signs of milk transfer for 7 minutes.   LC set up the manual pump and reviewed parts assembly, cleaning and milk storage.  Plan 1. To feed infant based on cues 8-12x  In 24 hour period no more than 4 hours without at attempt. Infant to latch both breasts during feed ensuring infant active at the breast.            2. Not to use a pacifier for first 4 weeks to establish a good latch at the breast.             3. Manual pump q 3 hour for 10 minutes after each feed.               4. I's and O's sheet reviewed               5. LC brochure of inpatient and outpatient services reviewed.

## 2020-07-03 NOTE — Anesthesia Procedure Notes (Addendum)
Epidural Patient location during procedure: OB Start time: 07/03/2020 5:13 AM End time: 07/03/2020 5:21 AM  Staffing Anesthesiologist: Mal Amabile, MD Performed: anesthesiologist   Preanesthetic Checklist Completed: patient identified, IV checked, site marked, risks and benefits discussed, surgical consent, monitors and equipment checked, pre-op evaluation and timeout performed  Epidural Patient position: sitting Prep: DuraPrep and site prepped and draped Patient monitoring: continuous pulse ox and blood pressure Approach: midline Location: L3-L4 Injection technique: LOR air  Needle:  Needle type: Tuohy  Needle gauge: 17 G Needle length: 9 cm and 9 Needle insertion depth: 7 cm Catheter type: closed end flexible Catheter size: 19 Gauge Catheter at skin depth: 12 cm Test dose: negative and Other  Assessment Events: blood not aspirated, injection not painful, no injection resistance, no paresthesia and negative IV test  Additional Notes Patient identified. Risks and benefits discussed including failed block, incomplete  Pain control, post dural puncture headache, nerve damage, paralysis, blood pressure Changes, nausea, vomiting, reactions to medications-both toxic and allergic and post Partum back pain. All questions were answered. Patient expressed understanding and wished to proceed. Sterile technique was used throughout procedure. Epidural site was Dressed with sterile barrier dressing. No paresthesias, signs of intravascular injection Or signs of intrathecal spread were encountered.  Patient was more comfortable after the epidural was dosed. Please see RN's note for documentation of vital signs and FHR which are stable. Reason for block:procedure for pain

## 2020-07-03 NOTE — Discharge Summary (Signed)
Postpartum Discharge Summary       Patient Name: Teresa [name] DOB: 08/17/2001 MRN: [id number]  Date of admission: 07/02/2020 Delivery date:07/03/2020  Delivering provider: Gerlene Fee  Date of discharge: 07/04/2020  Admitting diagnosis: Premature rupture of membranes [O42.90] Post-dates pregnancy [O48.0] Intrauterine pregnancy: [redacted]w[redacted]d    Secondary diagnosis:  Active Problems:   Late prenatal care affecting pregnancy, antepartum   Premature rupture of membranes   Post-dates pregnancy   Vaginal delivery  Additional problems: none    Discharge diagnosis: Term Pregnancy Delivered                                              Post partum procedures:n/a Augmentation: Pitocin and Cytotec Complications: None  Hospital course: Induction of Labor With Vaginal Delivery   19y.o. yo G1P0 at 470w4das admitted to the hospital 07/02/2020 for induction of labor.  Indication for induction: PROM.  Patient had an uncomplicated labor course as follows: Membrane Rupture Time/Date: 10:30 PM ,07/01/2020   Delivery Method:Vaginal, Spontaneous  Episiotomy: None  Lacerations:  1st degree;Perineal  Details of delivery can be found in separate delivery note.  Patient had a routine postpartum course. Patient is discharged home 07/04/20.  Newborn Data: Birth date:07/03/2020  Birth time:9:25 AM  Gender:Female  Living status:Living  Apgars:9 ,9  Weight:3501 g   Magnesium Sulfate received: No BMZ received: No Rhophylac:N/A MMR:N/A T-DaP:declined prenatally Flu: declined prenatally Transfusion:No  Physical exam  Vitals:   07/03/20 1400 07/03/20 1807 07/03/20 2230 07/04/20 0614  BP: 112/64 106/67 (!) 108/59 103/66  Pulse: 80 79 83 77  Resp: _0 Temp: 98.5 F (36.9 C) 98.7 F (37.1 C) 98.4 F (36.9 C) 98.5 F (36.9 C)  TempSrc: Oral Oral Oral Oral  SpO2:  98% 100% 99%  Weight:      Height:       General: alert, cooperative and no distress Lochia: appropriate Uterine  Fundus: firm Incision: N/A DVT Evaluation: No evidence of DVT seen on physical exam. Negative Homan's sign. No cords or calf tenderness. Labs: Lab Results  Component Value Date   WBC 10.7 (H) 07/02/2020   HGB 9.8 (L) 07/02/2020   HCT 30.0 (L) 07/02/2020   MCV 90.9 07/02/2020   PLT 219 07/02/2020   No flowsheet data found. Edinburgh Score: Edinburgh Postnatal Depression Scale Screening Tool 07/03/2020  I have been able to laugh and see the funny side of things. 0  I have looked forward with enjoyment to things. 0  I have blamed myself unnecessarily when things went wrong. 0  I have been anxious or worried for no good reason. 0  I have felt scared or panicky for no good reason. 0  Things have been getting on top of me. 0  I have been so unhappy that I have had difficulty sleeping. 0  I have felt sad or miserable. 0  I have been so unhappy that I have been crying. 0  The thought of harming myself has occurred to me. 0  Edinburgh Postnatal Depression Scale Total 0     After visit meds:  Allergies as of 07/04/2020   No Known Allergies     Medication List    TAKE these medications   ibuprofen 600 MG tablet Commonly known as: ADVIL Take 1 tablet (600 mg total) by mouth every 6 (  six) hours.   PNV PO Take by mouth.        Discharge home in stable condition Infant Feeding: Breast Infant Disposition:home with mother Discharge instruction: per After Visit Summary and Postpartum booklet. Activity: Advance as tolerated. Pelvic rest for 6 weeks.  Diet: routine diet Future Appointments: Future Appointments  Date Time Provider [place]  08/14/2020 10:30 AM Laury Deep, CNM [facility] None   Follow up Visit:  Follow-up Information    [facility] Follow up on 08/14/2020.   Specialty: Obstetrics and Gynecology Why: for postpartum checkup Contact information: [place] [phone]                Please schedule this patient for a In person postpartum visit in 6 weeks with the following provider: Any provider. Additional Postpartum F/U:none  Low risk pregnancy complicated by: uncomplicated Delivery mode:  Vaginal, Spontaneous  Anticipated Birth Control:  NFP   07/04/2020 Christin Fudge, CNM

## 2020-07-03 NOTE — Anesthesia Preprocedure Evaluation (Signed)
Anesthesia Evaluation  Patient identified by MRN, date of birth, ID band Patient awake    Reviewed: Allergy & Precautions, Patient's Chart, lab work & pertinent test results  History of Anesthesia Complications (+) Family history of anesthesia reaction  Airway Mallampati: II  TM Distance: >3 FB Neck ROM: Full    Dental  (+) Teeth Intact   Pulmonary neg pulmonary ROS,    Pulmonary exam normal breath sounds clear to auscultation       Cardiovascular negative cardio ROS Normal cardiovascular exam Rhythm:Regular Rate:Normal     Neuro/Psych negative neurological ROS  negative psych ROS   GI/Hepatic Neg liver ROS, GERD  ,  Endo/Other  Obesity  Renal/GU negative Renal ROS  negative genitourinary   Musculoskeletal negative musculoskeletal ROS (+)   Abdominal (+) + obese,   Peds  Hematology  (+) anemia ,   Anesthesia Other Findings   Reproductive/Obstetrics (+) Pregnancy PROM                             Anesthesia Physical Anesthesia Plan  ASA: II  Anesthesia Plan: Epidural   Post-op Pain Management:    Induction:   PONV Risk Score and Plan:   Airway Management Planned: Natural Airway  Additional Equipment:   Intra-op Plan:   Post-operative Plan:   Informed Consent: I have reviewed the patients History and Physical, chart, labs and discussed the procedure including the risks, benefits and alternatives for the proposed anesthesia with the patient or authorized representative who has indicated his/her understanding and acceptance.       Plan Discussed with: Anesthesiologist  Anesthesia Plan Comments:         Anesthesia Quick Evaluation

## 2020-07-03 NOTE — Anesthesia Postprocedure Evaluation (Signed)
Anesthesia Post Note  Patient: Teresa [name]  Procedure(s) Performed: AN AD HOC LABOR EPIDURAL     Patient location during evaluation: Mother Baby Anesthesia Type: Epidural Level of consciousness: awake Pain management: satisfactory to patient Vital Signs Assessment: post-procedure vital signs reviewed and stable Respiratory status: spontaneous breathing Cardiovascular status: stable Anesthetic complications: no   No complications documented.  Last Vitals:  Vitals:   07/03/20 1230 07/03/20 1400  BP: 110/60 112/64  Pulse: 83 80  Resp: 16 18  Temp: 37.2 C 36.9 C  SpO2: 99%     Last Pain:  Vitals:   07/03/20 1400  TempSrc: Oral  PainSc:    Pain Goal: Patients Stated Pain Goal: 0 (07/03/20 0615)                 Cephus Shelling

## 2020-07-03 NOTE — Progress Notes (Signed)
Labor Progress Note Teresa [name] is a 19 y.o. G1P0 at [redacted]w[redacted]d presented for PROM S: Feeling stronger contractions  O:  BP 114/66   Pulse 80   Temp 97.9 F (36.6 C) (Oral)   Resp (!) 22   Ht 5\' 7"  (1.702 m)   Wt 110 kg   LMP 09/23/2019   SpO2 99%   BMI 38.00 kg/m  EFM: 125/mod/accels present , intermittent variables in some late decels. Toco every 1-3 min  CVE: Dilation: 8 Effacement (%): 100 Cervical Position: Anterior Station: -1 Presentation: Vertex Exam by:: JBurris RN   A&P: 19 y.o. G1P0 [redacted]w[redacted]d  #Labor/PROM: Progressing well. S/p cytotec x1 and paused pitocin around 630 due to late variables, plan to restart once FHR has stabilized. #Pain: epidural #FWB: cat 2 for intermittent variables, though they have good recovery with reassuring variability. One prolonged decel which improved with IV bolus and holding pitocin at 18 #GBS negative   [redacted]w[redacted]d, MD 6:40 AM

## 2020-07-03 NOTE — Progress Notes (Signed)
Reported change in FHR, with a couple of late decels and trying to continue pitocin. Contractions tracing with small in duration on 69mu of Pitocin currently.Teresa [name] instructed may keep pitocin at 18 and not increase , plan to stop pitocin in am and give pt a break and prob will allow to eat then restart pitocin later on.

## 2020-07-04 ENCOUNTER — Ambulatory Visit: Payer: Self-pay

## 2020-07-04 MED ORDER — IBUPROFEN 600 MG PO TABS: 600.0000 mg | ORAL_TABLET | Freq: Four times a day (QID) | ORAL | 0 refills | Status: DC

## 2020-07-04 NOTE — Lactation Note (Addendum)
This note was copied from a baby's chart. Lactation Consultation Note  Patient Name: Teresa [name] [id number] Date: 07/04/2020 Reason for consult: Follow-up assessment;Mother's request;Difficult latch;Primapara;1st time breastfeeding;Term;Infant weight loss  Infant 40 weeks 35 hours old. Mom asked for assistance with the latch. LC arrived infant in and outfit swaddled in 2 blankets. LC assisted Mom stripping baby down to STS. LC applied heat and did breast massage and drops of colostrum given to infant. Infant latched but only hanging out at the breast. LC gave Mom a manual pump the night before but she only pumped 1 yesterday and did not pump today. LC instructed Mom to pre pump to increase let down.   LC returned and Mom had infant latched and nursing for 7 minutes. Mom stated she pre pumped and once colostrum drops noted she was able to latch infant and see signs of audible swallows. LC observed infant latched and helped her to get in a better position. LC noted milk transfer and audible swallows during feed.   Infant last voided at 11 am today.  LC alerted RN, Novella Olive, that infant needs to be monitored for urine output. Instructions given to start Mom on DEBP if there is no improvement during shift.

## 2020-07-04 NOTE — Discharge Instructions (Signed)

## 2020-07-04 NOTE — Clinical Social Work Maternal (Signed)
CLINICAL SOCIAL WORK MATERNAL/CHILD NOTE  Patient Details  Name: Teresa [name] MRN: [id number] Date of Birth: 2001/01/03  Date:  07/04/2020  Clinical Social Worker Initiating Note:  Darra Lis, [place] Date/Time: Initiated:  07/04/20/0920     Child's Name:  Teresa [name]   Biological Parents:  Mother   Need for Interpreter:  None   Reason for Referral:  Late or No Prenatal Care    Address:  [place] [address]    Phone number:  [phone] (home)     Additional phone number:   Household Members/Support Persons (HM/SP):   Household Member/Support Person 1, Household Member/Support Person 2   HM/SP Name Relationship DOB or Age  HM/SP -1 Teresa [name] Mother 02/23/1973  HM/SP -2 Teresa [name] Father 02/17/1968  HM/SP -3        HM/SP -4        HM/SP -5        HM/SP -6        HM/SP -7        HM/SP -8          Natural Supports (not living in the home):  Immediate Family   Professional Supports: None   Employment: Unemployed   Type of Work:     Education:  Nurse, adult   Homebound arranged:    Museum/gallery curator Resources:  Medicaid   Other Resources:  [employer]   Cultural/Religious Considerations Which May Impact Care:    Strengths:  Ability to meet basic needs , Home prepared for child    Psychotropic Medications:         Pediatrician:       Pediatrician List:   [place]      Pediatrician Fax Number:    Risk Factors/Current Problems:  None   Cognitive State:  Linear Thinking , Insightful , Alert    Mood/Affect:  Happy , Calm    CSW Assessment: CSW consulted for late prenatal care. CSW met with MOB to offer support and complete assessment. CSW entered room and introduced self and role. CSW observed maternal grandparents bedside. CSW politely asked MOB if they could step out for privacy, MOB agreed. Newborn was observed  being fed by MOB. MOB was engaged and pleasant throughout interaction. CSW informed MOB of reason for consult, MOB expressed understanding. MOB shared she resides with maternal grandparents. MOB is currently unemployed and receives [facility]. MOB denies any mental health history and stated she is not experiencing any SI, HI or involved in any DV.   MOB disclosed she received late prenatal care due to caring for her grandmother in [place]. MOB stated she did not return to the [place] until August. CSW informed MOB of the hospital drug screen policy. MOB was informed an UDS and CDS will be completed on baby. MOB informed that if either results positive for substances, a CPS report will be required. MOB expressed understanding. MOB denies any substance use during pregnancy. MOB has no prior CPS history.   CSW provided education regarding the baby blues period vs. perinatal mood disorders, discussed treatment and offered resources for mental health follow up if concerns arise. MOB declined additional resources.  CSW recommends self-evaluation during the postpartum time period using the New Mom Checklist from Postpartum Progress and encouraged MOB to contact a medical professional if symptoms are noted at any time.  CSW provided review of Sudden Infant Death Syndrome (SIDS) precautions. MOB stated baby will sleep in a bassinet once discharged home.  MOB has all of the essential needs to care for baby, including a new carseat. MOB is still in the process of choosing a pediatrician. MOB denies any transportation barriers.   CSW will continue to follow UDS/CDS and make a CPS report if warranted. CSW identifies no further need for intervention and no barriers to discharge at this time.     CSW Plan/Description:  No Further Intervention Required/No Barriers to Discharge, CSW Will Continue to Monitor Umbilical Cord Tissue Drug Screen Results and Make Report if [facility], [place],  Perinatal Mood and Anxiety Disorder (PMADs) Education, Child Protective Service Report , Sudden Infant Death Syndrome (SIDS) Education    Varney Baas 07/04/2020, 9:51 AM [phone]

## 2020-07-04 NOTE — Progress Notes (Addendum)
POSTPARTUM PROGRESS NOTE  Post Partum Day 2  Subjective:  Teresa [name] is a 19 y.o. G1P1001 s/p SVD at 70w4dwith history of late prenatal care for this delivery.  No acute events overnight.  Pt denies problems with ambulating, voiding or po intake.  She denies nausea or vomiting.  Pain is well controlled.  She has had flatus. She has not had bowel movement.  Lochia Minimal.   Objective: Blood pressure (!) 108/59, pulse 83, temperature 98.4 F (36.9 C), temperature source Oral, resp. rate 17, height 5' 7"  (1.702 m), weight 110 kg, last menstrual period 09/23/2019, SpO2 100 %, unknown if currently breastfeeding.  Physical Exam:  General: alert, cooperative and no distress Chest: no respiratory distress Heart:regular rate, distal pulses intact Abdomen: soft, nontender,  Uterine Fundus: firm, appropriately tender DVT Evaluation: No calf swelling or tenderness Extremities: No edema Skin: warm, dry with out lacerations.  Recent Labs    07/02/20 1418 07/02/20 1944  HGB 10.7* 9.8*  HCT 32.4* 30.0*    Assessment/Plan: Teresa MIFSUDis a 19y.o. G1P1001 s/p SVD at 478w4dith history of late prenatal care for this delivery  PPD#2 - Doing well Contraception: natural family planning  Feeding: Breast and Bottle  Dispo: Plan for discharge today.   LOS: 2 days   KiLajoyce LauberMedical Student, 07/04/2020, 4:45 AM   I personally saw and evaluated the patient, performing the key elements of the service. I developed and verified the management plan that is described in the resident's/student's note, and I agree with the content with my edits above. VSS, HRR&R, Resp unlabored, Legs neg.  FrNigel BertholdCNM 07/10/2020 9:54 AM

## 2020-07-04 NOTE — Lactation Note (Signed)
This note was copied from a baby's chart. Lactation Consultation Note  Patient Name: Teresa [name] Date: 07/04/2020 Reason for consult: Follow-up assessment   P1 mother whose infant is now 31 hours old.  This is a term baby at 40+4 weeks.    Mother desires an early discharge.  She has no questions/concerns related to breast feeding.  Mother denies pain with latching.  She has been feeding 8-12 times/24 hours or sooner if baby shows feeding cues.    Offered to return to observe/assist with latching at the next feeding.    Engorgement prevention/treatment reviewed.  Mother has a manual pump and a DEBP for home use.  Visitors in room.  RN updated.   Maternal Data    Feeding    LATCH Score                   Interventions    Lactation Tools Discussed/Used     Consult Status Date: 07/04/20 Follow-up type: Call as needed    Teresa [name] 07/04/2020, 11:35 AM

## 2020-07-05 ENCOUNTER — Inpatient Hospital Stay (HOSPITAL_COMMUNITY)
Admission: AD | Admit: 2020-07-05 | Payer: BC Managed Care – PPO | Source: Home / Self Care | Admitting: Obstetrics and Gynecology

## 2020-07-05 ENCOUNTER — Inpatient Hospital Stay (HOSPITAL_COMMUNITY): Payer: BC Managed Care – PPO

## 2020-07-05 ENCOUNTER — Ambulatory Visit: Payer: Self-pay

## 2020-07-05 NOTE — Lactation Note (Signed)
This note was copied from a baby's chart. Lactation Consultation Note Baby 26 hrs old. Cluster feeding. Mom stated the baby is BF good now, she thinks she's got it. Mom sitting on couch BF. Gave mom another pillow to bring baby closer to her and for mom not to be leaning over to baby. Mom done well latching. Praised mom. Mom can easily express colostrum.  Discussed milk stages, coming in, milk storage, pumping building storage, engorgement management, STS, I&O, when to call MD. Mom stated she felt good about going homer today and didn't need LC to come back. She felt like baby is doing well.  Reminded of OP services if needed.  Patient Name: Teresa [name] Date: 07/05/2020 Reason for consult: Follow-up assessment;Primapara;Term   Maternal Data Has patient been taught Hand Expression?: Yes Does the patient have breastfeeding experience prior to this delivery?: No  Feeding Feeding Type: Breast Fed  LATCH Score Latch: Grasps breast easily, tongue down, lips flanged, rhythmical sucking.  Audible Swallowing: Spontaneous and intermittent  Type of Nipple: Everted at rest and after stimulation  Comfort (Breast/Nipple): Filling, red/small blisters or bruises, mild/mod discomfort (a little tender)  Hold (Positioning): No assistance needed to correctly position infant at breast.  LATCH Score: 9  Interventions Interventions: Breast feeding basics reviewed;Support pillows;Position options;Skin to skin;Breast massage;Hand express;Breast compression;Adjust position;Hand pump  Lactation Tools Discussed/Used     Consult Status Consult Status: Complete Date: 07/05/20    Charyl Dancer 07/05/2020, 2:57 AM

## 2020-08-14 ENCOUNTER — Other Ambulatory Visit: Payer: Self-pay

## 2020-08-14 ENCOUNTER — Encounter: Payer: Self-pay | Admitting: Obstetrics and Gynecology

## 2020-08-14 ENCOUNTER — Ambulatory Visit (INDEPENDENT_AMBULATORY_CARE_PROVIDER_SITE_OTHER): Payer: BC Managed Care – PPO | Admitting: Obstetrics and Gynecology

## 2020-08-14 DIAGNOSIS — Z30011 Encounter for initial prescription of contraceptive pills: Secondary | ICD-10-CM

## 2020-08-14 MED ORDER — NORETHINDRONE 0.35 MG PO TABS: 1.0000 | ORAL_TABLET | Freq: Every day | ORAL | 2 refills | Status: DC

## 2020-08-14 NOTE — Patient Instructions (Signed)
Hormonal Contraception Information Hormonal contraception is a type of birth control that uses hormones to prevent pregnancy. It usually involves a combination of the hormones estrogen and progesterone or only the hormone progesterone. Hormonal contraception works in these ways:  It thickens the mucus in the cervix, making it harder for sperm to enter the uterus.  It changes the lining of the uterus, making it harder for an egg to implant.  It may stop the ovaries from releasing eggs (ovulation). Some women who take hormonal contraceptives that contain only progesterone may continue to ovulate. Hormonal contraception cannot prevent sexually transmitted infections (STIs). Pregnancy may still occur. Estrogen and progesterone contraceptives Contraceptives that use a combination of estrogen and progesterone are available in these forms:  Pill. Pills come in different combinations of hormones. They must be taken at the same time each day. Pills can affect your period, causing you to get your period once every three months or not at all.  Patch. The patch must be worn on the lower abdomen for three weeks and then removed on the fourth.  Vaginal ring. The ring is placed in the vagina and left there for three weeks. It is then removed for one week. Progesterone contraceptives Contraceptives that use progesterone only are available in these forms:  Pill. Pills should be taken every day of the cycle.  Intrauterine device (IUD). This device is inserted into the uterus and removed or replaced every five years or sooner.  Implant. Plastic rods are placed under the skin of the upper arm. They are removed or replaced every three years or sooner.  Injection. The injection is given once every 90 days. What are the side effects? The side effects of estrogen and progesterone contraceptives include:  Nausea.  Headaches.  Breast tenderness.  Bleeding or spotting between menstrual cycles.  High blood  pressure (rare).  Strokes, heart attacks, or blood clots (rare) Side effects of progesterone-only contraceptives include:  Nausea.  Headaches.  Breast tenderness.  Unpredictable menstrual bleeding.  High blood pressure (rare). Talk to your health care provider about what side effects may affect you. Where to find more information  Ask your health care provider for more information and resources about hormonal contraception.  U.S. Department of Health and Human Services Office on Women's Health: www.womenshealth.gov Questions to ask:  What type of hormonal contraception is right for me?  How long should I plan to use hormonal contraception?  What are the side effects of the hormonal contraception method I choose?  How can I prevent STIs while using hormonal contraception? Contact a health care provider if:  You start taking hormonal contraceptives and you develop persistent or severe side effects. Summary  Estrogen and progesterone are hormones used in many forms of birth control.  Talk to your health care provider about what side effects may affect you.  Hormonal contraception cannot prevent sexually transmitted infections (STIs).  Ask your health care provider for more information and resources about hormonal contraception. This information is not intended to replace advice given to you by your health care provider. Make sure you discuss any questions you have with your health care provider. Document Revised: 12/11/2018 Document Reviewed: 07/16/2016 Elsevier Patient Education  2020 [employer]  

## 2020-08-14 NOTE — Progress Notes (Signed)
Post Partum Visit Note  Teresa [name] is a 19 y.o. G66P1001 female who presents for a postpartum visit. She is 5 weeks postpartum following a normal spontaneous vaginal delivery.  I have fully reviewed the prenatal and intrapartum course. The delivery was at 40.4 gestational weeks.  Anesthesia: epidural. Postpartum course has been uncomplicated. Baby is doing well. Baby is feeding by breast. Bleeding moderate lochia and red. Bowel function is normal. Bladder function is normal. Patient is not sexually active. Contraception method is none. Postpartum depression screening: negative.   The pregnancy intention screening data noted above was reviewed. Potential methods of contraception were discussed. The patient elected to proceed with Oral Contraceptive.    Edinburgh Postnatal Depression Scale - 08/14/20 1033      Edinburgh Postnatal Depression Scale:  In the Past 7 Days   I have been able to laugh and see the funny side of things. 0    I have looked forward with enjoyment to things. 0    I have blamed myself unnecessarily when things went wrong. 0    I have been anxious or worried for no good reason. 0    I have felt scared or panicky for no good reason. 0    Things have been getting on top of me. 0    I have been so unhappy that I have had difficulty sleeping. 0    I have felt sad or miserable. 0    I have been so unhappy that I have been crying. 0    The thought of harming myself has occurred to me. 0    Edinburgh Postnatal Depression Scale Total 0            The following portions of the patient's history were reviewed and updated as appropriate: allergies, current medications, past family history, past medical history, past social history, past surgical history and problem list.  Review of Systems Constitutional: negative Eyes: negative Ears, nose, mouth, throat, and face: negative Respiratory: negative Cardiovascular: negative Gastrointestinal:  negative Genitourinary:negative Integument/breast: negative Hematologic/lymphatic: negative Musculoskeletal:negative Neurological: negative Behavioral/Psych: negative Endocrine: negative Allergic/Immunologic: negative    Objective:  BP 104/70 (BP Location: Left Arm, Patient Position: Sitting, Cuff Size: Normal)   Pulse 96   Temp 97.9 F (36.6 C) (Oral)   Ht 5\' 7"  (1.702 m)   Wt 232 lb 3.2 oz (105.3 kg)   LMP 08/05/2020   Breastfeeding Yes   BMI 36.37 kg/m    General:  alert, cooperative and no distress   Breasts:  inspection negative, no nipple discharge or bleeding, no masses or nodularity palpable  Lungs: clear to auscultation bilaterally  Heart:  regular rate and rhythm, S1, S2 normal, no murmur, click, rub or gallop  Abdomen: soft, non-tender; bowel sounds normal; no masses,  no organomegaly   Vulva:  not evaluated  Vagina: not evaluated  Cervix:  not evaluated  Corpus: not examined  Adnexa:  not evaluated  Rectal Exam: Not performed.        Assessment:  Encounter for postpartum care of lactating mother - Normal postpartum exam. Pap smear not done at today's visit.  Encounter for initial prescription of contraceptive pills  - Rx for norethindrone (MICRONOR) 0.35 MG tablet 90 day supply  Plan:   Essential components of care per ACOG recommendations:  1.  Mood and well being: Patient with negative depression screening today. Reviewed local resources for support.  - Patient does not use tobacco. - hx of drug use? No  2. Infant care and feeding:  -Patient currently breastmilk feeding? Yes If breastmilk feeding discussed return to work and pumping. If needed, patient was provided letter for work to allow for every 2-3 hr pumping breaks, and to be granted a private location to express breastmilk and refrigerated area to store breastmilk. Reviewed importance of draining breast regularly to support lactation. -Social determinants of health (SDOH) reviewed in EPIC. No  concerns  3. Sexuality, contraception and birth spacing - Patient does not want a pregnancy in the next year.  Desired family size is unsure of number of children.  - Reviewed forms of contraception in tiered fashion. Patient desired oral progesterone-only contraceptive today.   - Discussed birth spacing of 18 months  4. Sleep and fatigue -Encouraged family/partner/community support of 4 hrs of uninterrupted sleep to help with mood and fatigue  5. Physical Recovery  - Discussed patients delivery and complications - Patient had a 1st degree laceration, perineal healing reviewed. Patient expressed understanding - Patient has urinary incontinence? No - Patient is safe to resume physical and sexual activity  6.  Health Maintenance - Last pap smear done 04/30/2020 and was normal with absent endocervical/transformation zone. According to ASCCP, routine screening is recommended. Mammogram - n/a  7. No Chronic Disease - PCP follow up  [name], CNM [facility] [employer]

## 2020-12-10 ENCOUNTER — Other Ambulatory Visit: Payer: Self-pay | Admitting: Obstetrics and Gynecology

## 2020-12-10 DIAGNOSIS — Z30011 Encounter for initial prescription of contraceptive pills: Secondary | ICD-10-CM

## 2020-12-10 MED ORDER — NORETHINDRONE 0.35 MG PO TABS: 1.0000 | ORAL_TABLET | Freq: Every day | ORAL | 2 refills | Status: DC

## 2021-04-02 ENCOUNTER — Other Ambulatory Visit (HOSPITAL_COMMUNITY)
Admission: RE | Admit: 2021-04-02 | Discharge: 2021-04-02 | Disposition: A | Discharge status: Home / Self Care | Payer: BC Managed Care – PPO | Source: Ambulatory Visit | Attending: Obstetrics and Gynecology | Admitting: Obstetrics and Gynecology

## 2021-04-02 ENCOUNTER — Encounter: Payer: Self-pay | Admitting: Obstetrics and Gynecology

## 2021-04-02 ENCOUNTER — Ambulatory Visit (INDEPENDENT_AMBULATORY_CARE_PROVIDER_SITE_OTHER): Payer: BC Managed Care – PPO | Admitting: Obstetrics and Gynecology

## 2021-04-02 ENCOUNTER — Other Ambulatory Visit: Payer: Self-pay

## 2021-04-02 VITALS — BP 122/62 | HR 86 | Temp 98.6°F | Wt 230.6 lb

## 2021-04-02 DIAGNOSIS — Z3A16 16 weeks gestation of pregnancy: Secondary | ICD-10-CM | POA: Diagnosis not present

## 2021-04-02 DIAGNOSIS — Z3480 Encounter for supervision of other normal pregnancy, unspecified trimester: Secondary | ICD-10-CM | POA: Insufficient documentation

## 2021-04-02 DIAGNOSIS — Z3482 Encounter for supervision of other normal pregnancy, second trimester: Secondary | ICD-10-CM | POA: Insufficient documentation

## 2021-04-02 LAB — HEPATITIS C ANTIBODY: HCV Ab: NEGATIVE

## 2021-04-02 MED ORDER — BLOOD PRESSURE MONITOR AUTOMAT DEVI: 1.0000 | Freq: Every day | 0 refills | Status: DC

## 2021-04-02 MED ORDER — GOJJI WEIGHT SCALE MISC: 1.0000 | Freq: Every day | 0 refills | Status: DC | PRN

## 2021-04-02 NOTE — Progress Notes (Signed)
INITIAL OBSTETRICAL VISIT Patient name: Teresa [name] MRN [id number]  Date of birth: 2001-04-12 Chief Complaint:   Initial Prenatal Visit  History of Present Illness:   Teresa [name] is a 20 y.o. G18P1001 Hispanic female at [redacted]w[redacted]d by 9.6 wks U/S (done in [place] - scanned in media tab) with an Estimated Date of Delivery: 09/13/21 being seen today for her initial obstetrical visit.  Her obstetrical history is significant for  currently pregnant while taking Miconor . This is an unplanned pregnancy. She and the father of the baby (FOB) "Joaquin" live together. She has a support system that consists of FOB/her  family/friends. Today she reports no complaints. She was in [place] visiting with her grandparents when she started having N/V and founf out she was pregnant.  Patient's last menstrual period was 08/05/2020. Last pap 04/2020. Results were:  normal with absent transformation zone. Review of Systems:   Pertinent items are noted in HPI Denies cramping/contractions, leakage of fluid, vaginal bleeding, abnormal vaginal discharge w/ itching/odor/irritation, headaches, visual changes, shortness of breath, chest pain, abdominal pain, severe nausea/vomiting, or problems with urination or bowel movements unless otherwise stated above.  Pertinent History Reviewed:  Reviewed past medical,surgical, social, obstetrical and family history.  Reviewed problem list, medications and allergies. OB History  Gravida Para Term Preterm AB Living  2 1 1     1   SAB IAB Ectopic Multiple Live Births        0 1    # Outcome Date GA Lbr Len/2nd Weight Sex Delivery Anes PTL Lv  2 Current           1 Term 07/03/20 [redacted]w[redacted]d 32:35 / 01:20 7 lb 11.5 oz (3.501 kg) M Vag-Spont EPI  LIV   Physical Assessment:   Vitals:   04/02/21 0936  BP: 122/62  Pulse: 86  Temp: 98.6 F (37 C)  Weight: 230 lb 9.6 oz (104.6 kg)  Body mass index is 36.12 kg/m.       Physical Examination:  General appearance - well  appearing, and in no distress  Mental status - alert, oriented to person, place, and time  Psych:  She has a normal mood and affect  Skin - warm and dry, normal color, no suspicious lesions noted  Chest - effort normal, all lung fields clear to auscultation bilaterally  Heart - normal rate and regular rhythm  Abdomen - soft, nontender  Extremities:  No swelling or varicosities noted  Pelvic - deferred   FHTs by doppler: 155 bpm  Assessment & Plan:  1) Low-Risk Pregnancy G2P1001 at [redacted]w[redacted]d with an Estimated Date of Delivery: 09/13/21   2) Initial OB visit - Welcomed to practice and introduced self to patient in addition to discussing other advanced practice providers that she may be seeing at this practice - Congratulated patient - Anticipatory guidance on upcoming appointments - Educated on COVID19 and pregnancy and the integration of virtual appointments  - Educated on babyscripts app- patient reports she has not received email, encouraged to look in spam folder and to call office if she still has not received email - patient verbalizes understanding    3) Supervision of other normal pregnancy, antepartum - 09/15/21 MFM OB COMP + 14 WK; Future - Obstetric Panel, Including HIV - Culture, OB Urine - Hemoglobin A1c - Genetic Screening - Panorama only (Horizon negative in previous pregnancy) - Cervicovaginal ancillary only( [place]) - AFP, Serum, Open Spina Bifida - Blood Pressure Monitoring (BLOOD PRESSURE MONITOR AUTOMAT) DEVI; 1  Device by Does not apply route daily. Automatic blood pressure cuff regular size. To monitor blood pressure regularly at home. ICD-10 code:Z34.90  Dispense: 1 each; Refill: 0 - Misc. Devices (GOJJI WEIGHT SCALE) MISC; 1 Device by Does not apply route daily as needed. To weight self daily as needed at home. ICD-10 code: Z34.90  Dispense: 1 each; Refill: 0   4) 16 weeks pregnancy  Meds:  Meds ordered this encounter  Medications   Blood Pressure Monitoring  (BLOOD PRESSURE MONITOR AUTOMAT) DEVI    Sig: 1 Device by Does not apply route daily. Automatic blood pressure cuff regular size. To monitor blood pressure regularly at home. ICD-10 code:Z34.90    Dispense:  1 each    Refill:  0   Misc. Devices (GOJJI WEIGHT SCALE) MISC    Sig: 1 Device by Does not apply route daily as needed. To weight self daily as needed at home. ICD-10 code: Z34.90    Dispense:  1 each    Refill:  0    Initial labs obtained Continue prenatal vitamins Reviewed n/v relief measures and warning s/s to report Reviewed recommended weight gain based on pre-gravid BMI Encouraged well-balanced diet Genetic Screening discussed: ordered Cystic fibrosis, SMA, Fragile X screening discussed ordered The nature of [place] [place] with multiple MDs and other Advanced Practice Providers was explained to patient; also emphasized that residents, students are part of our team.  Discussed optimized OB schedule and video visits. Advised can have an in-office visit whenever she feels she needs to be seen.  Does not have own BP cuff. BP cuff Rx faxed today. Explained to patient that BP will be mailed to her house. Check BP weekly, let us know if >140/90. Advised to call during normal business hours and there is an after-hours nurse line available.    Follow-up: Return in about 4 weeks (around 04/30/2021) for Return OB - My Chart video.   Orders Placed This Encounter  Procedures   Culture, OB Urine   Urine Culture, OB Reflex   [place] MFM OB COMP + 14 WK   Obstetric Panel, Including HIV   Hemoglobin A1c   Genetic Screening   AFP, Serum, Open Spina Bifida    [name] MSN, CNM 04/02/2021

## 2021-04-04 LAB — URINE CULTURE, OB REFLEX

## 2021-04-04 LAB — AFP, SERUM, OPEN SPINA BIFIDA
AFP MoM: 1.11
AFP Value: 29.8 ng/mL
Gest. Age on Collection Date: 16 weeks
Maternal Age At EDD: 22.3 yr
OSBR Risk 1 IN: 8647
Test Results:: NEGATIVE
Weight: 230 [lb_av]

## 2021-04-04 LAB — HEMOGLOBIN A1C
Est. average glucose Bld gHb Est-mCnc: 85 mg/dL
Hgb A1c MFr Bld: 4.6 % — ABNORMAL LOW (ref 4.8–5.6)

## 2021-04-04 LAB — OBSTETRIC PANEL, INCLUDING HIV
Antibody Screen: NEGATIVE
Basophils Absolute: 0 10*3/uL (ref 0.0–0.2)
Basos: 0 %
EOS (ABSOLUTE): 0.1 10*3/uL (ref 0.0–0.4)
Eos: 1 %
HIV Screen 4th Generation wRfx: NONREACTIVE
Hematocrit: 34.5 % (ref 34.0–46.6)
Hemoglobin: 11.7 g/dL (ref 11.1–15.9)
Hepatitis B Surface Ag: NEGATIVE
Immature Grans (Abs): 0 10*3/uL (ref 0.0–0.1)
Immature Granulocytes: 1 %
Lymphocytes Absolute: 2.3 10*3/uL (ref 0.7–3.1)
Lymphs: 30 %
MCH: 30.3 pg (ref 26.6–33.0)
MCHC: 33.9 g/dL (ref 31.5–35.7)
MCV: 89 fL (ref 79–97)
Monocytes Absolute: 0.4 10*3/uL (ref 0.1–0.9)
Monocytes: 6 %
Neutrophils Absolute: 4.8 10*3/uL (ref 1.4–7.0)
Neutrophils: 62 %
Platelets: 213 10*3/uL (ref 150–450)
RBC: 3.86 x10E6/uL (ref 3.77–5.28)
RDW: 13.5 % (ref 11.7–15.4)
RPR Ser Ql: NONREACTIVE
Rh Factor: POSITIVE
Rubella Antibodies, IGG: 1.79 index (ref >0.99)
WBC: 7.6 10*3/uL (ref 3.4–10.8)

## 2021-04-04 LAB — CULTURE, OB URINE

## 2021-04-05 ENCOUNTER — Encounter: Payer: Self-pay | Admitting: Obstetrics and Gynecology

## 2021-04-05 LAB — CERVICOVAGINAL ANCILLARY ONLY
Bacterial Vaginitis (gardnerella): NEGATIVE
Candida Glabrata: NEGATIVE
Candida Vaginitis: NEGATIVE
Chlamydia: NEGATIVE
Comment: NEGATIVE
Comment: NEGATIVE
Comment: NEGATIVE
Comment: NEGATIVE
Comment: NEGATIVE
Comment: NORMAL
Neisseria Gonorrhea: NEGATIVE
Trichomonas: NEGATIVE

## 2021-04-27 ENCOUNTER — Ambulatory Visit: Payer: BC Managed Care – PPO | Attending: Obstetrics and Gynecology

## 2021-04-27 ENCOUNTER — Other Ambulatory Visit: Payer: Self-pay | Admitting: *Deleted

## 2021-04-27 ENCOUNTER — Other Ambulatory Visit: Payer: Self-pay

## 2021-04-27 DIAGNOSIS — Z6836 Body mass index (BMI) 36.0-36.9, adult: Secondary | ICD-10-CM

## 2021-04-27 DIAGNOSIS — Z3480 Encounter for supervision of other normal pregnancy, unspecified trimester: Secondary | ICD-10-CM | POA: Insufficient documentation

## 2021-04-30 ENCOUNTER — Telehealth: Payer: Self-pay | Admitting: Obstetrics and Gynecology

## 2021-04-30 ENCOUNTER — Telehealth: Payer: BC Managed Care – PPO | Admitting: Obstetrics and Gynecology

## 2021-04-30 NOTE — Telephone Encounter (Signed)
Attempted to reach patient via telephone for her virtual visit. After the third call, she is now a No Show. I left a detailed voice message for her to keep her ultrasound appointment, and next scheduled appointment with the office.

## 2021-05-22 ENCOUNTER — Encounter: Payer: Self-pay | Admitting: *Deleted

## 2021-05-22 ENCOUNTER — Ambulatory Visit: Payer: BC Managed Care – PPO | Attending: Obstetrics

## 2021-05-22 ENCOUNTER — Ambulatory Visit: Payer: BC Managed Care – PPO | Admitting: *Deleted

## 2021-05-22 ENCOUNTER — Other Ambulatory Visit: Payer: Self-pay

## 2021-05-22 ENCOUNTER — Other Ambulatory Visit: Payer: Self-pay | Admitting: *Deleted

## 2021-05-22 VITALS — BP 110/54 | HR 74

## 2021-05-22 DIAGNOSIS — Z3480 Encounter for supervision of other normal pregnancy, unspecified trimester: Secondary | ICD-10-CM | POA: Diagnosis not present

## 2021-05-22 DIAGNOSIS — Z6836 Body mass index (BMI) 36.0-36.9, adult: Secondary | ICD-10-CM | POA: Diagnosis not present

## 2021-05-22 DIAGNOSIS — Z3A23 23 weeks gestation of pregnancy: Secondary | ICD-10-CM | POA: Diagnosis not present

## 2021-05-22 DIAGNOSIS — Z362 Encounter for other antenatal screening follow-up: Secondary | ICD-10-CM | POA: Diagnosis not present

## 2021-05-22 DIAGNOSIS — O99212 Obesity complicating pregnancy, second trimester: Secondary | ICD-10-CM | POA: Diagnosis not present

## 2021-05-22 DIAGNOSIS — E669 Obesity, unspecified: Secondary | ICD-10-CM

## 2021-05-22 DIAGNOSIS — Z6835 Body mass index (BMI) 35.0-35.9, adult: Secondary | ICD-10-CM

## 2021-05-28 ENCOUNTER — Telehealth: Payer: BC Managed Care – PPO | Admitting: Obstetrics and Gynecology

## 2021-05-28 ENCOUNTER — Other Ambulatory Visit: Payer: Self-pay

## 2021-06-25 ENCOUNTER — Encounter: Payer: Self-pay | Admitting: Obstetrics and Gynecology

## 2021-06-25 ENCOUNTER — Ambulatory Visit (INDEPENDENT_AMBULATORY_CARE_PROVIDER_SITE_OTHER): Payer: BC Managed Care – PPO | Admitting: Obstetrics and Gynecology

## 2021-06-25 ENCOUNTER — Other Ambulatory Visit: Payer: Self-pay

## 2021-06-25 VITALS — BP 112/71 | HR 92 | Temp 97.7°F | Wt 241.2 lb

## 2021-06-25 DIAGNOSIS — O99213 Obesity complicating pregnancy, third trimester: Secondary | ICD-10-CM | POA: Insufficient documentation

## 2021-06-25 DIAGNOSIS — Z3A28 28 weeks gestation of pregnancy: Secondary | ICD-10-CM

## 2021-06-25 DIAGNOSIS — Z3480 Encounter for supervision of other normal pregnancy, unspecified trimester: Secondary | ICD-10-CM

## 2021-06-25 LAB — HEPATITIS C ANTIBODY: HCV Ab: NEGATIVE

## 2021-06-25 MED ORDER — ASPIRIN 81 MG PO CHEW: 81.0000 mg | CHEWABLE_TABLET | Freq: Every day | ORAL | 6 refills | Status: DC

## 2021-06-25 NOTE — Progress Notes (Signed)
   LOW-RISK PREGNANCY OFFICE VISIT Patient name: Teresa [name] MRN [id number]  Date of birth: September 06, 2000 Chief Complaint:   Routine Prenatal Visit  History of Present Illness:   Teresa [name] is a 20 y.o. G62P1001 female at [redacted]w[redacted]d with an Estimated Date of Delivery: 09/13/21 being seen today for ongoing management of a low-risk pregnancy.  Today she reports no complaints. Contractions: Not present. Vag. Bleeding: None.  Movement: Present. denies leaking of fluid. Review of Systems:   Pertinent items are noted in HPI Denies abnormal vaginal discharge w/ itching/odor/irritation, headaches, visual changes, shortness of breath, chest pain, abdominal pain, severe nausea/vomiting, or problems with urination or bowel movements unless otherwise stated above. Pertinent History Reviewed:  Reviewed past medical,surgical, social, obstetrical and family history.  Reviewed problem list, medications and allergies. Physical Assessment:   Vitals:   06/25/21 0820  BP: 112/71  Pulse: 92  Temp: 97.7 F (36.5 C)  Weight: 241 lb 3.2 oz (109.4 kg)  Body mass index is 37.78 kg/m.        Physical Examination:   General appearance: Well appearing, and in no distress  Mental status: Alert, oriented to person, place, and time  Skin: Warm & dry  Cardiovascular: Normal heart rate noted  Respiratory: Normal respiratory effort, no distress  Abdomen: Soft, gravid, nontender  Pelvic: Cervical exam deferred         Extremities: Edema: None  Fetal Status: Fetal Heart Rate (bpm): 145 Fundal Height: 32 cm Movement: Present    No results found for this or any previous visit (from the past 24 hour(s)).  Assessment & Plan:  1) Low-risk pregnancy G2P1001 at [redacted]w[redacted]d with an Estimated Date of Delivery: 09/13/21   2) Supervision of other normal pregnancy, antepartum  - Glucose Tolerance, 2 Hours w/1 Hour,  - RPR,  - HIV antibody (with reflex),  - CBC,  - Hepatitis C antibody  3) Obesity affecting pregnancy in  third trimester  - Rx for aspirin 81 MG chewable tablet,  - Referral to Nutrition and Diabetes Services    4) [redacted] weeks gestation of pregnancy   Meds:  Meds ordered this encounter  Medications   aspirin 81 MG chewable tablet    Sig: Chew 1 tablet (81 mg total) by mouth daily.    Dispense:  30 tablet    Refill:  6    Order Specific Question:   Supervising Provider    Answer:   Reva Bores [[id number]]   Labs/procedures today: 2 hr GTT and 3rd trimester labs  Plan:  Continue routine obstetrical care  Reviewed: Preterm labor symptoms and general obstetric precautions including but not limited to vaginal bleeding, contractions, leaking of fluid and fetal movement were reviewed in detail with the patient.  All questions were answered. Has home bp cuff. Check bp weekly, let us know if >140/90.   Follow-up: Return in about 4 weeks (around 07/23/2021) for Return OB visit.  Orders Placed This Encounter  Procedures   Glucose Tolerance, 2 Hours w/1 Hour   RPR   HIV antibody (with reflex)   CBC   Hepatitis C antibody   Referral to Nutrition and Diabetes Services   [name] MSN, CNM 06/25/2021 8:52 AM

## 2021-06-25 NOTE — Patient Instructions (Signed)
Iron-Rich Diet Iron is a mineral that helps your body produce hemoglobin. Hemoglobin is a protein in red blood cells that carries oxygen to your body's tissues. Eating too little iron may cause you to feel weak and tired, and it can increase your risk of infection. Iron is naturally found in many foods, and many foods have iron added to them (are iron-fortified). You may need to follow an iron-rich diet if you do not have enough iron in your body due to certain medical conditions. The amount of iron that you need each day depends on your age, your sex, and any medical conditions you have. Follow instructions from your health care provider or a dietitian about how much iron you should eat each day. What are tips for following this plan? Reading food labels Check food labels to see how many milligrams (mg) of iron are in each serving. Cooking Cook foods in pots and pans that are made from iron. Take these steps to make it easier for your body to absorb iron from certain foods: Soak beans overnight before cooking. Soak whole grains overnight and drain them before using. Ferment flours before baking, such as by using yeast in bread dough. Meal planning When you eat foods that contain iron, you should eat them with foods that are high in vitamin C. These include oranges, peppers, tomatoes, potatoes, and mangoes. Vitamin C helps your body absorb iron. Certain foods and drinks prevent your body from absorbing iron properly. Avoid eating these foods in the same meal as iron-rich foods or with iron supplements. These foods include: Coffee, black tea, and red wine. Milk, dairy products, and foods that are high in calcium. Beans and soybeans. Whole grains. General information Take iron supplements only as told by your health care provider. An overdose of iron can be life-threatening. If you were prescribed iron supplements, take them with orange juice or a vitamin C supplement. When you eat iron-fortified  foods or take an iron supplement, you should also eat foods that naturally contain iron, such as meat, poultry, and fish. Eating naturally iron-rich foods helps your body absorb the iron that is added to other foods or contained in a supplement. Iron from animal sources is better absorbed than iron from plant sources. What foods should I eat? Fruits Prunes. Raisins. Eat fruits high in vitamin C, such as oranges, grapefruits, and strawberries, with iron-rich foods. Vegetables Spinach (cooked). Green peas. Broccoli. Fermented vegetables. Eat vegetables high in vitamin C, such as leafy greens, potatoes, bell peppers, and tomatoes, with iron-rich foods. Grains Iron-fortified breakfast cereal. Iron-fortified whole-wheat bread. Enriched rice. Sprouted grains. Meats and other proteins Beef liver. Beef. [place]. Chicken. Oysters. Shrimp. [place]. Sardines. Chickpeas. Nuts. Tofu. Pumpkin seeds. Beverages Tomato juice. Fresh orange juice. Prune juice. Hibiscus tea. Iron-fortified instant breakfast shakes. Sweets and desserts Blackstrap molasses. Seasonings and condiments Tahini. Fermented soy sauce. Other foods Wheat germ. The items listed above may not be a complete list of recommended foods and beverages. Contact a dietitian for more information. What foods should I limit? These are foods that should be limited while eating iron-rich foods as they can reduce the absorption of iron in your body. Grains Whole grains. Bran cereal. Bran flour. Meats and other proteins Soybeans. Products made from soy protein. Black beans. Lentils. Mung beans. Split peas. Dairy Milk. Cream. Cheese. Yogurt. Cottage cheese. Beverages Coffee. Black tea. Red wine. Sweets and desserts Cocoa. Chocolate. Ice cream. Seasonings and condiments Basil. Oregano. Large amounts of parsley. The items listed above  may not be a complete list of foods and beverages you should limit. Contact a dietitian for more  information. Summary Iron is a mineral that helps your body produce hemoglobin. Hemoglobin is a protein in red blood cells that carries oxygen to your body's tissues. Iron is naturally found in many foods, and many foods have iron added to them (are iron-fortified). When you eat foods that contain iron, you should eat them with foods that are high in vitamin C. Vitamin C helps your body absorb iron. Certain foods and drinks prevent your body from absorbing iron properly, such as whole grains and dairy products. You should avoid eating these foods in the same meal as iron-rich foods or with iron supplements. This information is not intended to replace advice given to you by your health care provider. Make sure you discuss any questions you have with your health care provider. Document Revised: 07/28/2020 Document Reviewed: 07/28/2020 Elsevier Patient Education  2022 [employer] Inc.  Fetal Movement Counts Patient Name: ________________________________________________ Patient Due Date: ____________________ What is a fetal movement count? A fetal movement count is the number of times that you feel your baby move during a certain amount of time. This may also be called a fetal kick count. A fetal movement count is recommended for every pregnant woman. You may be asked to start counting fetal movements as early as week 28 of your pregnancy. Pay attention to when your baby is most active. You may notice your baby's sleep and wake cycles. You may also notice things that make your baby move more. You should do a fetal movement count: When your baby is normally most active. At the same time each day. A good time to count movements is while you are resting, after having something to eat and drink. How do I count fetal movements? Find a quiet, comfortable area. Sit, or lie down on your side. Write down the date, the start time and stop time, and the number of movements that you felt between those two times.  Take this information with you to your health care visits. Write down your start time when you feel the first movement. Count kicks, flutters, swishes, rolls, and jabs. You should feel at least 10 movements. You may stop counting after you have felt 10 movements, or if you have been counting for 2 hours. Write down the stop time. If you do not feel 10 movements in 2 hours, contact your health care provider for further instructions. Your health care provider may want to do additional tests to assess your baby's well-being. Contact a health care provider if: You feel fewer than 10 movements in 2 hours. Your baby is not moving like he or she usually does. Date: ____________ Start time: ____________ Stop time: ____________ Movements: ____________ Date: ____________ Start time: ____________ Stop time: ____________ Movements: ____________ Date: ____________ Start time: ____________ Stop time: ____________ Movements: ____________ Date: ____________ Start time: ____________ Stop time: ____________ Movements: ____________ Date: ____________ Start time: ____________ Stop time: ____________ Movements: ____________ Date: ____________ Start time: ____________ Stop time: ____________ Movements: ____________ Date: ____________ Start time: ____________ Stop time: ____________ Movements: ____________ Date: ____________ Start time: ____________ Stop time: ____________ Movements: ____________ Date: ____________ Start time: ____________ Stop time: ____________ Movements: ____________ This information is not intended to replace advice given to you by your health care provider. Make sure you discuss any questions you have with your health care provider. Document Revised: 04/05/2019 Document Reviewed: 04/05/2019 Elsevier Patient Education  2022 [employer].  Third  Trimester of Pregnancy The third trimester of pregnancy is from week 28 through week 40. This is also called months 7 through 9. This trimester is when  your unborn baby (fetus) is growing very fast. At the end of the ninth month, the unborn baby is about 20 inches long. It weighs about 6-10 pounds. Body changes during your third trimester Your body continues to go through many changes during this time. The changes vary and generally return to normal after the baby is born. Physical changes Your weight will continue to increase. You may gain 25-35 pounds (11-16 kg) by the end of the pregnancy. If you are underweight, you may gain 28-40 lb (about 13-18 kg). If you are overweight, you may gain 15-25 lb (about 7-11 kg). You may start to get stretch marks on your hips, belly (abdomen), and breasts. Your breasts will continue to grow and may hurt. A yellow fluid (colostrum) may leak from your breasts. This is the first milk you are making for your baby. You may have changes in your hair. Your belly button may stick out. You may have more swelling in your hands, face, or ankles. Health changes You may have heartburn. You may have trouble pooping (constipation). You may get hemorrhoids. These are swollen veins in the butt that can itch or get painful. You may have swollen veins (varicose veins) in your legs. You may have more body aches in the pelvis, back, or thighs. You may have more tingling or numbness in your hands, arms, and legs. The skin on your belly may also feel numb. You may feel short of breath as your womb (uterus) gets bigger. Other changes You may pee (urinate) more often. You may have more problems sleeping. You may notice the unborn baby "dropping," or moving lower in your belly. You may have more discharge coming from your vagina. Your joints may feel loose, and you may have pain around your pelvic bone. Follow these instructions at home: Medicines Take over-the-counter and prescription medicines only as told by your doctor. Some medicines are not safe during pregnancy. Take a prenatal vitamin that contains at least 600  micrograms (mcg) of folic acid. Eating and drinking Eat healthy meals that include: Fresh fruits and vegetables. Whole grains. Good sources of protein, such as meat, eggs, or tofu. Low-fat dairy products. Avoid raw meat and unpasteurized juice, milk, and cheese. These carry germs that can harm you and your baby. Eat 4 or 5 small meals rather than 3 large meals a day. You may need to take these actions to prevent or treat trouble pooping: Drink enough fluids to keep your pee (urine) pale yellow. Eat foods that are high in fiber. These include beans, whole grains, and fresh fruits and vegetables. Limit foods that are high in fat and sugar. These include fried or sweet foods. Activity Exercise only as told by your doctor. Stop exercising if you start to have cramps in your womb. Avoid heavy lifting. Do not exercise if it is too hot or too humid, or if you are in a place of great height (high altitude). If you choose to, you may have sex unless your doctor tells you not to. Relieving pain and discomfort Take breaks often, and rest with your legs raised (elevated) if you have leg cramps or low back pain. Take warm water baths (sitz baths) to soothe pain or discomfort caused by hemorrhoids. Use hemorrhoid cream if your doctor approves. Wear a good support bra if your breasts are tender.  If you develop bulging, swollen veins in your legs: Wear support hose as told by your doctor. Raise your feet for 15 minutes, 3-4 times a day. Limit salt in your food. Safety Talk to your doctor before traveling far distances. Do not use hot tubs, steam rooms, or saunas. Wear your seat belt at all times when you are in a car. Talk with your doctor if someone is hurting you or yelling at you a lot. Preparing for your baby's arrival To prepare for the arrival of your baby: Take prenatal classes. Visit the hospital and tour the maternity area. Buy a rear-facing car seat. Learn how to install it in your  car. Prepare the baby's room. Take out all pillows and stuffed animals from the baby's crib. General instructions Avoid cat litter boxes and soil used by cats. These carry germs that can cause harm to the baby and can cause a loss of your baby by miscarriage or stillbirth. Do not douche or use tampons. Do not use scented sanitary pads. Do not smoke or use any products that contain nicotine or tobacco. If you need help quitting, ask your doctor. Do not drink alcohol. Do not use herbal medicines, illegal drugs, or medicines that were not approved by your doctor. Chemicals in these products can affect your baby. Keep all follow-up visits. This is important. Where to find more information American Pregnancy Association: americanpregnancy.org [employer]: www.acog.org Office on Women's Health: [employer] Contact a doctor if: You have a fever. You have mild cramps or pressure in your lower belly. You have a nagging pain in your belly area. You vomit, or you have watery poop (diarrhea). You have bad-smelling fluid coming from your vagina. You have pain when you pee, or your pee smells bad. You have a headache that does not go away when you take medicine. You have changes in how you see, or you see spots in front of your eyes. Get help right away if: Your water breaks. You have regular contractions that are less than 5 minutes apart. You are spotting or bleeding from your vagina. You have very bad belly cramps or pain. You have trouble breathing. You have chest pain. You faint. You have not felt the baby move for the amount of time told by your doctor. You have new or increased pain, swelling, or redness in an arm or leg. Summary The third trimester is from week 28 through week 40 (months 7 through 9). This is the time when your unborn baby is growing very fast. During this time, your discomfort may increase as you gain weight and as  your baby grows. Get ready for your baby to arrive by taking prenatal classes, buying a rear-facing car seat, and preparing the baby's room. Get help right away if you are bleeding from your vagina, you have chest pain and trouble breathing, or you have not felt the baby move for the amount of time told by your doctor. This information is not intended to replace advice given to you by your health care provider. Make sure you discuss any questions you have with your health care provider. Document Revised: 01/23/2020 Document Reviewed: 11/29/2019 Elsevier Patient Education  2022 [employer].

## 2021-06-26 LAB — CBC
Hematocrit: 31.6 % — ABNORMAL LOW (ref 34.0–46.6)
Hemoglobin: 10.5 g/dL — ABNORMAL LOW (ref 11.1–15.9)
MCH: 30.1 pg (ref 26.6–33.0)
MCHC: 33.2 g/dL (ref 31.5–35.7)
MCV: 91 fL (ref 79–97)
Platelets: 207 10*3/uL (ref 150–450)
RBC: 3.49 x10E6/uL — ABNORMAL LOW (ref 3.77–5.28)
RDW: 13.5 % (ref 11.7–15.4)
WBC: 8.7 10*3/uL (ref 3.4–10.8)

## 2021-06-26 LAB — GLUCOSE TOLERANCE, 2 HOURS W/ 1HR
Glucose, 1 hour: 95 mg/dL (ref 70–179)
Glucose, 2 hour: 82 mg/dL (ref 70–152)
Glucose, Fasting: 80 mg/dL (ref 70–91)

## 2021-06-26 LAB — RPR: RPR Ser Ql: NONREACTIVE

## 2021-06-26 LAB — HEPATITIS C ANTIBODY: Hep C Virus Ab: <0.1 s/co ratio (ref 0.0–0.9)

## 2021-06-26 LAB — HIV ANTIBODY (ROUTINE TESTING W REFLEX): HIV Screen 4th Generation wRfx: NONREACTIVE

## 2021-06-30 ENCOUNTER — Encounter: Payer: Self-pay | Admitting: Obstetrics and Gynecology

## 2021-06-30 DIAGNOSIS — O99019 Anemia complicating pregnancy, unspecified trimester: Secondary | ICD-10-CM | POA: Insufficient documentation

## 2021-07-01 ENCOUNTER — Other Ambulatory Visit: Payer: Self-pay | Admitting: *Deleted

## 2021-07-01 DIAGNOSIS — O99019 Anemia complicating pregnancy, unspecified trimester: Secondary | ICD-10-CM

## 2021-07-01 MED ORDER — FERROUS SULFATE 325 (65 FE) MG PO TABS: 325.0000 mg | ORAL_TABLET | ORAL | 3 refills | Status: DC

## 2021-07-01 MED ORDER — ASCORBIC ACID 500 MG PO TABS: 500.0000 mg | ORAL_TABLET | ORAL | 3 refills | Status: DC

## 2021-07-02 ENCOUNTER — Other Ambulatory Visit: Payer: BC Managed Care – PPO

## 2021-07-07 ENCOUNTER — Other Ambulatory Visit: Payer: BC Managed Care – PPO

## 2021-07-07 ENCOUNTER — Other Ambulatory Visit: Payer: Self-pay

## 2021-07-08 ENCOUNTER — Other Ambulatory Visit: Payer: BC Managed Care – PPO

## 2021-07-21 ENCOUNTER — Telehealth: Payer: Self-pay | Admitting: Medical

## 2021-07-21 ENCOUNTER — Encounter: Payer: BC Managed Care – PPO | Admitting: Medical

## 2021-07-21 NOTE — Telephone Encounter (Signed)
Attempted to reach patient about rescheduling her missed appointment. Was not able to get an answer.

## 2021-07-27 ENCOUNTER — Ambulatory Visit: Payer: BC Managed Care – PPO

## 2021-08-05 ENCOUNTER — Ambulatory Visit: Payer: BC Managed Care – PPO | Attending: Obstetrics and Gynecology | Admitting: *Deleted

## 2021-08-05 ENCOUNTER — Other Ambulatory Visit: Payer: Self-pay

## 2021-08-05 ENCOUNTER — Ambulatory Visit (HOSPITAL_BASED_OUTPATIENT_CLINIC_OR_DEPARTMENT_OTHER): Payer: BC Managed Care – PPO

## 2021-08-05 ENCOUNTER — Encounter: Payer: Self-pay | Admitting: *Deleted

## 2021-08-05 VITALS — BP 109/60 | HR 95

## 2021-08-05 DIAGNOSIS — Z3A34 34 weeks gestation of pregnancy: Secondary | ICD-10-CM | POA: Diagnosis not present

## 2021-08-05 DIAGNOSIS — Z3480 Encounter for supervision of other normal pregnancy, unspecified trimester: Secondary | ICD-10-CM

## 2021-08-05 DIAGNOSIS — E669 Obesity, unspecified: Secondary | ICD-10-CM

## 2021-08-05 DIAGNOSIS — O99213 Obesity complicating pregnancy, third trimester: Secondary | ICD-10-CM

## 2021-08-05 DIAGNOSIS — Z6835 Body mass index (BMI) 35.0-35.9, adult: Secondary | ICD-10-CM

## 2021-08-05 DIAGNOSIS — O99019 Anemia complicating pregnancy, unspecified trimester: Secondary | ICD-10-CM

## 2021-08-13 ENCOUNTER — Other Ambulatory Visit: Payer: Self-pay

## 2021-08-13 ENCOUNTER — Ambulatory Visit (INDEPENDENT_AMBULATORY_CARE_PROVIDER_SITE_OTHER): Payer: BC Managed Care – PPO | Admitting: Obstetrics and Gynecology

## 2021-08-13 VITALS — BP 133/74 | HR 96 | Temp 97.5°F | Wt 244.8 lb

## 2021-08-13 DIAGNOSIS — O99213 Obesity complicating pregnancy, third trimester: Secondary | ICD-10-CM

## 2021-08-13 DIAGNOSIS — Z3480 Encounter for supervision of other normal pregnancy, unspecified trimester: Secondary | ICD-10-CM

## 2021-08-13 DIAGNOSIS — Z3A35 35 weeks gestation of pregnancy: Secondary | ICD-10-CM

## 2021-08-13 NOTE — Progress Notes (Signed)
° °  LOW-RISK PREGNANCY OFFICE VISIT Patient name: Teresa [name] MRN [id number]  Date of birth: October 08, 2000 Chief Complaint:   Routine Prenatal Visit  History of Present Illness:   Teresa [name] is a 20 y.o. G40P1001 female at [redacted]w[redacted]d with an Estimated Date of Delivery: 09/13/21 being seen today for ongoing management of a low-risk pregnancy.  Today she reports no complaints. Contractions: Not present. Vag. Bleeding: None.  Movement: Present. denies leaking of fluid. Review of Systems:   Pertinent items are noted in HPI Denies abnormal vaginal discharge w/ itching/odor/irritation, headaches, visual changes, shortness of breath, chest pain, abdominal pain, severe nausea/vomiting, or problems with urination or bowel movements unless otherwise stated above. Pertinent History Reviewed:  Reviewed past medical,surgical, social, obstetrical and family history.  Reviewed problem list, medications and allergies. Physical Assessment:   Vitals:   08/13/21 0908  BP: 133/74  Pulse: 96  Temp: (!) 97.5 F (36.4 C)  Weight: 244 lb 12.8 oz (111 kg)  Body mass index is 38.34 kg/m.        Physical Examination:   General appearance: Well appearing, and in no distress  Mental status: Alert, oriented to person, place, and time  Skin: Warm & dry  Cardiovascular: Normal heart rate noted  Respiratory: Normal respiratory effort, no distress  Abdomen: Soft, gravid, nontender  Pelvic: Cervical exam deferred         Extremities: Edema: None  Fetal Status: Fetal Heart Rate (bpm): 153 Fundal Height: 38 cm Movement: Present    No results found for this or any previous visit (from the past 24 hour(s)).  Assessment & Plan:  1) Low-risk pregnancy G2P1001 at [redacted]w[redacted]d with an Estimated Date of Delivery: 09/13/21   2) Supervision of other normal pregnancy, antepartum - Anticipatory guidance for GBS screening at 36 wks. Explained the test is important to be done at this time in pregnancy to ensure adequate  treatment at the time of delivery. Explained that a positive result does not mean any harm to her, but can be harmful to the baby. Meaning that if baby is exposed to the bacteria for too long without antibiotics, the baby has the potential to develop pneumonia, septicemia, or spinal meningitis and could end up in the NICU. Also, explained that a cervical exam may be performed at the time of testing to get a baseline cervical check and make sure there is no preterm cervical dilation.   3) Obesity affecting pregnancy in third trimester - Taking daily bASA  4) [redacted] weeks gestation of pregnancy    Meds: No orders of the defined types were placed in this encounter.  Labs/procedures today: none  Plan:  Continue routine obstetrical care   Reviewed: Preterm labor symptoms and general obstetric precautions including but not limited to vaginal bleeding, contractions, leaking of fluid and fetal movement were reviewed in detail with the patient.  All questions were answered. Has home bp cuff. Check bp weekly, let us know if >140/90.   Follow-up: Return in about 1 week (around 08/20/2021) for Return OB w/GBS.  No orders of the defined types were placed in this encounter.  [name] MSN, CNM 08/13/2021 11:47 AM

## 2021-08-13 NOTE — Patient Instructions (Signed)
Group B Streptococcus Test During Pregnancy °Why am I having this test? °Routine testing, also called screening, for group B streptococcus (GBS) is recommended for all pregnant women between the 36th and 37th week of pregnancy. GBS is a type of bacteria that can be passed from mother to baby during childbirth. Screening will help guide whether or not you will need treatment during labor and delivery to prevent complications such as: °An infection in your uterus during labor. °An infection in your uterus after delivery. °A serious infection in your baby after delivery, such as pneumonia, meningitis, or sepsis. °GBS screening is not often done before 36 weeks of pregnancy unless you go into labor prematurely. °What happens if I have group B streptococcus? °If testing shows that you have GBS, your health care provider will recommend treatment with IV antibiotics during labor and delivery. This treatment significantly decreases the risk of complications for you and your baby. °If you have a planned C-section and you have GBS, you may not need to be treated with antibiotics because GBS is usually passed to babies after labor starts and your water breaks. If you are in labor or your water breaks before your C-section, it is possible for GBS to get into your uterus and be passed to your baby, so you might need treatment. °Is there a chance I may not need to be tested? °You may not need to be tested for GBS if: °You have a urine test that shows GBS before 36 to 37 weeks. °You had a baby with GBS infection after a previous delivery. °In these cases, you will automatically be treated for GBS during labor and delivery. °What is being tested? °This test is done to check if you have group B streptococcus in your vagina or rectum. °What kind of sample is taken? °To collect samples for this test, your health care provider will swab your vagina and rectum with a cotton swab. The sample is then sent to the lab to see if GBS is  present. °What happens during the test? ° °You will remove your clothing from the waist down. °You will lie down on an exam table in the same position as you would for a pelvic exam. °Your health care provider will swab your vagina and rectum to collect samples for a culture test. °You will be able to go home after the test and do all your usual activities. °How are the results reported? °The test results are reported as positive or negative. °What do the results mean? °A positive test means you are at risk for passing GBS to your baby during labor and delivery. Your health care provider will recommend that you are treated with an IV antibiotic during labor and delivery. °A negative test means you are at very low risk of passing GBS to your baby. There is still a low risk of passing GBS to your baby because sometimes test results may report that you do not have a condition when you do (false-negative result) or there is a chance that you may become infected with GBS after the test is done. You most likely will not need to be treated with an antibiotic during labor and delivery. °Talk with your health care provider about what your results mean. °Questions to ask your health care provider °Ask your health care provider, or the department that is doing the test: °When will my results be ready? °How will I get my results? °What are my treatment options? °Summary °Routine testing (screening) for   group B streptococcus (GBS) is recommended for all pregnant women between the 36th and 37th week of pregnancy. °GBS is a type of bacteria that can be passed from mother to baby during childbirth. °If testing shows that you have GBS, your health care provider will recommend that you are treated with IV antibiotics during labor and delivery. This treatment almost always prevents infection in newborns. °This information is not intended to replace advice given to you by your health care provider. Make sure you discuss any questions  you have with your health care provider. °Document Revised: 06/17/2020 Document Reviewed: 09/13/2018 °Elsevier Patient Education © 2022 [employer] Inc. ° °

## 2021-08-20 ENCOUNTER — Encounter: Payer: BC Managed Care – PPO | Admitting: Obstetrics and Gynecology

## 2021-08-27 ENCOUNTER — Encounter: Payer: Self-pay | Admitting: Obstetrics and Gynecology

## 2021-08-27 ENCOUNTER — Other Ambulatory Visit (HOSPITAL_COMMUNITY)
Admission: RE | Admit: 2021-08-27 | Discharge: 2021-08-27 | Disposition: A | Discharge status: Home / Self Care | Payer: BC Managed Care – PPO | Source: Ambulatory Visit | Attending: Obstetrics and Gynecology | Admitting: Obstetrics and Gynecology

## 2021-08-27 ENCOUNTER — Ambulatory Visit (INDEPENDENT_AMBULATORY_CARE_PROVIDER_SITE_OTHER): Payer: BC Managed Care – PPO | Admitting: Obstetrics and Gynecology

## 2021-08-27 ENCOUNTER — Other Ambulatory Visit: Payer: Self-pay

## 2021-08-27 VITALS — BP 106/66 | HR 74 | Temp 97.9°F | Wt 249.8 lb

## 2021-08-27 DIAGNOSIS — Z3483 Encounter for supervision of other normal pregnancy, third trimester: Secondary | ICD-10-CM | POA: Insufficient documentation

## 2021-08-27 DIAGNOSIS — O99213 Obesity complicating pregnancy, third trimester: Secondary | ICD-10-CM

## 2021-08-27 DIAGNOSIS — Z3480 Encounter for supervision of other normal pregnancy, unspecified trimester: Secondary | ICD-10-CM | POA: Diagnosis not present

## 2021-08-27 DIAGNOSIS — Z3A37 37 weeks gestation of pregnancy: Secondary | ICD-10-CM

## 2021-08-27 LAB — OB RESULTS CONSOLE GC/CHLAMYDIA: Gonorrhea: NEGATIVE

## 2021-08-27 NOTE — Progress Notes (Signed)
° °  LOW-RISK PREGNANCY OFFICE VISIT Patient name: Teresa [name] MRN [id number]  Date of birth: [birth year] Chief Complaint:   Routine Prenatal Visit  History of Present Illness:   Teresa [name] is a 20 y.o. G6P1001 female at [redacted]w[redacted]d with an Estimated Date of Delivery: 09/13/21 being seen today for ongoing management of a low-risk pregnancy.  Today she reports no complaints. Contractions: Not present. Vag. Bleeding: None.  Movement: Present. denies leaking of fluid. Review of Systems:   Pertinent items are noted in HPI Denies abnormal vaginal discharge w/ itching/odor/irritation, headaches, visual changes, shortness of breath, chest pain, abdominal pain, severe nausea/vomiting, or problems with urination or bowel movements unless otherwise stated above. Pertinent History Reviewed:  Reviewed past medical,surgical, social, obstetrical and family history.  Reviewed problem list, medications and allergies. Physical Assessment:   Vitals:   08/27/21 0915  BP: 106/66  Pulse: 74  Temp: 97.9 F (36.6 C)  Weight: 249 lb 12.8 oz (113.3 kg)  Body mass index is 39.12 kg/m.        Physical Examination:   General appearance: Well appearing, and in no distress  Mental status: Alert, oriented to person, place, and time  Skin: Warm & dry  Cardiovascular: Normal heart rate noted  Respiratory: Normal respiratory effort, no distress  Abdomen: Soft, gravid, nontender  Pelvic: Cervical exam deferred         Extremities: Edema: None  Fetal Status: Fetal Heart Rate (bpm): 137 Fundal Height: 38 cm Movement: Present Presentation: Vertex  No results found for this or any previous visit (from the past 24 hour(s)).  Assessment & Plan:  1) Low-risk pregnancy G2P1001 at [redacted]w[redacted]d with an Estimated Date of Delivery: 09/13/21   2) Supervision of other normal pregnancy, antepartum  - Culture, beta strep (group b only),  - Cervicovaginal ancillary only( [place])  3) [redacted] weeks gestation of pregnancy   4)  Obesity affecting pregnancy in third trimester  - Taking daily bASA  Meds: No orders of the defined types were placed in this encounter.  Labs/procedures today: GBS, GC/CT and   Plan:  Continue routine obstetrical care   Reviewed: Term labor symptoms and general obstetric precautions including but not limited to vaginal bleeding, contractions, leaking of fluid and fetal movement were reviewed in detail with the patient.  All questions were answered. Has home bp cuff. Check bp weekly, let us know if >140/90.   Follow-up: Return in about 1 week (around 09/03/2021) for Return OB visit.  Orders Placed This Encounter  Procedures   Culture, beta strep (group b only)   [name] MSN, CNM 08/27/2021 9:27 AM

## 2021-08-28 LAB — CERVICOVAGINAL ANCILLARY ONLY
Chlamydia: NEGATIVE
Comment: NEGATIVE
Comment: NEGATIVE
Comment: NORMAL
Neisseria Gonorrhea: NEGATIVE
Trichomonas: NEGATIVE

## 2021-08-30 NOTE — L&D Delivery Note (Addendum)
OB/GYN Faculty Practice Delivery Note  Teresa [name] is a 21 y.o. G2P1001 at [redacted]w[redacted]d admitted for SOL.   GBS Status: Negative/-- (12/29 0921) Maximum Maternal Temperature: 98.4  Labor course: Initial SVE: 3.5 cm. Augmentation with: N/A. She then progressed to complete.  ROM: 0h 76m with light meconium stained fluid  Birth: At 2205 a viable female was delivered via spontaneous vaginal delivery (Presentation: ROA) in the bed with RN present. Call from RN received at 2205 to come to room since baby delivered precipitously in the bed. Nuchal cord present: No.  Shoulders and body delivered in usual fashion. At the arrival of CNM in room infant was being placed directly on mom's abdomen for bonding/skin-to-skin, baby dried and stimulated. Cord clamped x 2 after 1 minute and cut by FOB.  Cord blood collected.  The placenta separated spontaneously and delivered via gentle cord traction by CNM.  Pitocin infused rapidly IV per protocol.  Fundus firm with massage. Brisk large amounts of bleeding noted. Cytotec 800 mcg inserted rectally using sterile glove by CNM. LR bolus of 500 mL started and TXA infusing. Placenta inspected and appears to be intact with a 3 VC.  Placenta/Cord with the following complications: none.  Cord pH: n/a Sponge and instrument count were correct x2.  Intrapartum complications:  precipitous delivery Anesthesia:  epidural Episiotomy: none Lacerations:  none Suture Repair:  n/a EBL (mL): 500   Infant: APGAR (1 MIN): 9   APGAR (5 MINS): 10   Infant weight: 7 lbs 11.1 oz (3490 gm)  Mom to postpartum.  Baby to Couplet care / Skin to Skin. Placenta to L&D   Plans to Breastfeed Contraception: Depo-Provera injections Circumcision: declines  Note sent to [facility]: Teresa [name] for pp visit.  Teresa [name] , MSN, CNM 09/18/2021 10:32 PM

## 2021-08-31 LAB — CULTURE, BETA STREP (GROUP B ONLY): Strep Gp B Culture: NEGATIVE

## 2021-09-09 ENCOUNTER — Encounter: Payer: Self-pay | Admitting: Certified Nurse Midwife

## 2021-09-12 ENCOUNTER — Encounter: Payer: Self-pay | Admitting: Obstetrics and Gynecology

## 2021-09-16 ENCOUNTER — Encounter (HOSPITAL_COMMUNITY): Payer: Self-pay | Admitting: Family Medicine

## 2021-09-16 ENCOUNTER — Other Ambulatory Visit: Payer: Self-pay

## 2021-09-16 ENCOUNTER — Other Ambulatory Visit: Payer: Self-pay | Admitting: Family Medicine

## 2021-09-16 ENCOUNTER — Ambulatory Visit (INDEPENDENT_AMBULATORY_CARE_PROVIDER_SITE_OTHER): Payer: Medicaid Other | Admitting: Advanced Practice Midwife

## 2021-09-16 VITALS — BP 112/63 | HR 76 | Temp 98.1°F | Wt 252.8 lb

## 2021-09-16 DIAGNOSIS — O99213 Obesity complicating pregnancy, third trimester: Secondary | ICD-10-CM

## 2021-09-16 DIAGNOSIS — Z3480 Encounter for supervision of other normal pregnancy, unspecified trimester: Secondary | ICD-10-CM

## 2021-09-16 DIAGNOSIS — O0930 Supervision of pregnancy with insufficient antenatal care, unspecified trimester: Secondary | ICD-10-CM

## 2021-09-16 DIAGNOSIS — Z3A40 40 weeks gestation of pregnancy: Secondary | ICD-10-CM

## 2021-09-16 NOTE — Progress Notes (Signed)
° °  PRENATAL VISIT NOTE  Subjective:  Teresa [name] is a 21 y.o. G2P1001 at [redacted]w[redacted]d being seen today for ongoing prenatal care.  She is currently monitored for the following issues for this low-risk pregnancy and has Supervision of other normal pregnancy, antepartum; Obesity affecting pregnancy in third trimester; and Anemia during pregnancy on their problem list.  Patient reports occasional contractions.  Contractions: Not present. Vag. Bleeding: None.  Movement: Present. Denies leaking of fluid.   The following portions of the patient's history were reviewed and updated as appropriate: allergies, current medications, past family history, past medical history, past social history, past surgical history and problem list. Problem list updated.  Objective:   Vitals:   09/16/21 1036  BP: 112/63  Pulse: 76  Temp: 98.1 F (36.7 C)  Weight: 252 lb 12.8 oz (114.7 kg)    Fetal Status: Fetal Heart Rate (bpm): 136 Fundal Height: 39 cm Movement: Present  Presentation: Vertex  General:  Alert, oriented and cooperative. Patient is in no acute distress.  Skin: Skin is warm and dry. No rash noted.   Cardiovascular: Normal heart rate noted  Respiratory: Normal respiratory effort, no problems with respiration noted  Abdomen: Soft, gravid, appropriate for gestational age.  Pain/Pressure: Absent     Pelvic: Cervical exam performed Dilation: Closed Effacement (%): Thick Station: Ballotable  Extremities: Normal range of motion.  Edema: None  Mental Status: Normal mood and affect. Normal behavior. Normal judgment and thought content.   Assessment and Plan:  Pregnancy: G2P1001 at [redacted]w[redacted]d  1. Supervision of other normal pregnancy, antepartum - LOB, normotensive - PDIOL scheduled for 41.0 weeks, plan for Cytotec - Hx SROM with P1 - Preemptive teaching: IOL beginning with Cytotec, foley bulb when able, bridge to Pitocin or AROM   2. Obesity affecting pregnancy in third trimester - 252 lbs, FH  appropriate  3. [redacted] weeks gestation of pregnancy - Reactive NST in office - Baseline 130, mod var, + 15 x 15 accels, no decels - Toco: occasional contractions  4. Insufficient antepartum care - Approximately 5 prenatal visits since August 2022 - Plan for inpatient postpartum LCSW  Term labor symptoms and general obstetric precautions including but not limited to vaginal bleeding, contractions, leaking of fluid and fetal movement were reviewed in detail with the patient. Please refer to After Visit Summary for other counseling recommendations.  No follow-ups on file.  Future Appointments  Date Time Provider Department Center  09/20/2021  7:00 AM [facility] SCHED ROOM [facility] None  10/28/2021  9:35 AM Bernerd Limbo, CNM [facility] None    [place], [place]

## 2021-09-17 ENCOUNTER — Telehealth (HOSPITAL_COMMUNITY): Payer: Self-pay | Admitting: *Deleted

## 2021-09-17 ENCOUNTER — Encounter (HOSPITAL_COMMUNITY): Payer: Self-pay

## 2021-09-17 ENCOUNTER — Encounter (HOSPITAL_COMMUNITY): Payer: Self-pay | Admitting: *Deleted

## 2021-09-17 NOTE — Telephone Encounter (Signed)
Preadmission screen  

## 2021-09-18 ENCOUNTER — Other Ambulatory Visit: Payer: Self-pay

## 2021-09-18 ENCOUNTER — Inpatient Hospital Stay (HOSPITAL_COMMUNITY): Payer: Medicaid Other | Admitting: Anesthesiology

## 2021-09-18 ENCOUNTER — Encounter (HOSPITAL_COMMUNITY): Payer: Self-pay | Admitting: Obstetrics & Gynecology

## 2021-09-18 ENCOUNTER — Inpatient Hospital Stay (HOSPITAL_COMMUNITY)
Admission: AD | Admit: 2021-09-18 | Discharge: 2021-09-20 | DRG: 807 | Disposition: A | Discharge status: Home / Self Care | Payer: Medicaid Other | Attending: Obstetrics & Gynecology | Admitting: Obstetrics & Gynecology

## 2021-09-18 ENCOUNTER — Encounter: Payer: Self-pay | Admitting: Obstetrics and Gynecology

## 2021-09-18 DIAGNOSIS — Z3A41 41 weeks gestation of pregnancy: Secondary | ICD-10-CM | POA: Diagnosis present

## 2021-09-18 DIAGNOSIS — O99214 Obesity complicating childbirth: Secondary | ICD-10-CM | POA: Diagnosis not present

## 2021-09-18 DIAGNOSIS — O99019 Anemia complicating pregnancy, unspecified trimester: Secondary | ICD-10-CM

## 2021-09-18 DIAGNOSIS — Z3A40 40 weeks gestation of pregnancy: Secondary | ICD-10-CM | POA: Diagnosis not present

## 2021-09-18 DIAGNOSIS — O480 Post-term pregnancy: Secondary | ICD-10-CM | POA: Diagnosis not present

## 2021-09-18 DIAGNOSIS — O9902 Anemia complicating childbirth: Secondary | ICD-10-CM | POA: Diagnosis not present

## 2021-09-18 DIAGNOSIS — Z3480 Encounter for supervision of other normal pregnancy, unspecified trimester: Secondary | ICD-10-CM

## 2021-09-18 DIAGNOSIS — Z20822 Contact with and (suspected) exposure to covid-19: Secondary | ICD-10-CM | POA: Diagnosis not present

## 2021-09-18 DIAGNOSIS — O4292 Full-term premature rupture of membranes, unspecified as to length of time between rupture and onset of labor: Secondary | ICD-10-CM | POA: Diagnosis not present

## 2021-09-18 DIAGNOSIS — D649 Anemia, unspecified: Secondary | ICD-10-CM | POA: Diagnosis not present

## 2021-09-18 LAB — CBC
HCT: 30.2 % — ABNORMAL LOW (ref 36.0–46.0)
Hemoglobin: 10.3 g/dL — ABNORMAL LOW (ref 12.0–15.0)
MCH: 30.6 pg (ref 26.0–34.0)
MCHC: 34.1 g/dL (ref 30.0–36.0)
MCV: 89.6 fL (ref 80.0–100.0)
Platelets: 222 10*3/uL (ref 150–400)
RBC: 3.37 MIL/uL — ABNORMAL LOW (ref 3.87–5.11)
RDW: 14.5 % (ref 11.5–15.5)
WBC: 9.3 10*3/uL (ref 4.0–10.5)
nRBC: 0 % (ref 0.0–0.2)

## 2021-09-18 LAB — RESP PANEL BY RT-PCR (FLU A&B, COVID) ARPGX2
Influenza A by PCR: NEGATIVE
Influenza B by PCR: NEGATIVE
SARS Coronavirus 2 by RT PCR: NEGATIVE

## 2021-09-18 LAB — TYPE AND SCREEN
ABO/RH(D): A POS
Antibody Screen: NEGATIVE

## 2021-09-18 MED ORDER — EPHEDRINE 5 MG/ML INJ: 10.0000 mg | INTRAVENOUS | Status: DC | PRN

## 2021-09-18 MED ORDER — MISOPROSTOL 200 MCG PO TABS
800.0000 ug | ORAL_TABLET | Freq: Once | ORAL | Status: AC
  Administered 2021-09-18: 800 ug via VAGINAL

## 2021-09-18 MED ORDER — OXYTOCIN BOLUS FROM INFUSION
333.0000 mL | Freq: Once | INTRAVENOUS | Status: AC
  Administered 2021-09-18: 333 mL via INTRAVENOUS

## 2021-09-18 MED ORDER — MISOPROSTOL 200 MCG PO TABS
ORAL_TABLET | ORAL | Status: AC
  Administered 2021-09-18: 800 ug via RECTAL
  Filled 2021-09-18: qty 4

## 2021-09-18 MED ORDER — PHENYLEPHRINE 40 MCG/ML (10ML) SYRINGE FOR IV PUSH (FOR BLOOD PRESSURE SUPPORT): 80.0000 ug | PREFILLED_SYRINGE | INTRAVENOUS | Status: DC | PRN

## 2021-09-18 MED ORDER — DIPHENHYDRAMINE HCL 50 MG/ML IJ SOLN: 12.5000 mg | INTRAMUSCULAR | Status: DC | PRN

## 2021-09-18 MED ORDER — FENTANYL-BUPIVACAINE-NACL 0.5-0.125-0.9 MG/250ML-% EP SOLN
EPIDURAL | Status: AC
  Filled 2021-09-18: qty 250

## 2021-09-18 MED ORDER — TERBUTALINE SULFATE 1 MG/ML IJ SOLN: 0.2500 mg | Freq: Once | INTRAMUSCULAR | Status: DC | PRN

## 2021-09-18 MED ORDER — ONDANSETRON HCL 4 MG/2ML IJ SOLN: 4.0000 mg | Freq: Four times a day (QID) | INTRAMUSCULAR | Status: DC | PRN

## 2021-09-18 MED ORDER — LIDOCAINE HCL (PF) 1 % IJ SOLN: 30.0000 mL | INTRAMUSCULAR | Status: DC | PRN

## 2021-09-18 MED ORDER — SOD CITRATE-CITRIC ACID 500-334 MG/5ML PO SOLN: 30.0000 mL | ORAL | Status: DC | PRN

## 2021-09-18 MED ORDER — MISOPROSTOL 50MCG HALF TABLET: 50.0000 ug | ORAL_TABLET | ORAL | Status: DC | PRN

## 2021-09-18 MED ORDER — MISOPROSTOL 200 MCG PO TABS: 200.0000 ug | ORAL_TABLET | Freq: Once | ORAL | Status: DC

## 2021-09-18 MED ORDER — TRANEXAMIC ACID-NACL 1000-0.7 MG/100ML-% IV SOLN
INTRAVENOUS | Status: AC
  Administered 2021-09-18: 1000 mg via INTRAVENOUS
  Filled 2021-09-18: qty 100

## 2021-09-18 MED ORDER — LACTATED RINGERS IV SOLN: 500.0000 mL | Freq: Once | INTRAVENOUS | Status: DC

## 2021-09-18 MED ORDER — TRANEXAMIC ACID-NACL 1000-0.7 MG/100ML-% IV SOLN: 1000.0000 mg | INTRAVENOUS | Status: AC

## 2021-09-18 MED ORDER — ACETAMINOPHEN 325 MG PO TABS: 650.0000 mg | ORAL_TABLET | ORAL | Status: DC | PRN

## 2021-09-18 MED ORDER — OXYTOCIN-SODIUM CHLORIDE 30-0.9 UT/500ML-% IV SOLN
2.5000 [IU]/h | INTRAVENOUS | Status: DC
  Filled 2021-09-18: qty 500

## 2021-09-18 MED ORDER — LACTATED RINGERS IV SOLN: 500.0000 mL | INTRAVENOUS | Status: DC | PRN

## 2021-09-18 MED ORDER — PHENYLEPHRINE 40 MCG/ML (10ML) SYRINGE FOR IV PUSH (FOR BLOOD PRESSURE SUPPORT)
80.0000 ug | PREFILLED_SYRINGE | INTRAVENOUS | Status: DC | PRN
  Filled 2021-09-18: qty 10

## 2021-09-18 MED ORDER — MISOPROSTOL 200 MCG PO TABS: 800.0000 ug | ORAL_TABLET | Freq: Once | ORAL | Status: AC

## 2021-09-18 MED ORDER — LIDOCAINE HCL (PF) 1 % IJ SOLN
INTRAMUSCULAR | Status: DC | PRN
Start: 2021-09-18 — End: 2021-09-18
  Administered 2021-09-18: 11 mL via EPIDURAL

## 2021-09-18 MED ORDER — FENTANYL-BUPIVACAINE-NACL 0.5-0.125-0.9 MG/250ML-% EP SOLN
12.0000 mL/h | EPIDURAL | Status: DC | PRN
  Administered 2021-09-18: 12 mL/h via EPIDURAL

## 2021-09-18 MED ORDER — LACTATED RINGERS IV SOLN: INTRAVENOUS | Status: DC

## 2021-09-18 NOTE — MAU Note (Signed)
Pt sitting up eating, EFM not tracing.

## 2021-09-18 NOTE — Anesthesia Preprocedure Evaluation (Signed)
Anesthesia Evaluation  Patient identified by MRN, date of birth, ID band Patient awake    Reviewed: Allergy & Precautions, Patient's Chart, lab work & pertinent test results  History of Anesthesia Complications (+) Family history of anesthesia reaction  Airway Mallampati: II  TM Distance: >3 FB Neck ROM: Full    Dental  (+) Teeth Intact   Pulmonary neg pulmonary ROS,    Pulmonary exam normal breath sounds clear to auscultation       Cardiovascular negative cardio ROS Normal cardiovascular exam Rhythm:Regular Rate:Normal     Neuro/Psych negative neurological ROS  negative psych ROS   GI/Hepatic Neg liver ROS, GERD  ,  Endo/Other  Obesity  Renal/GU negative Renal ROS  negative genitourinary   Musculoskeletal negative musculoskeletal ROS (+)   Abdominal (+) + obese,   Peds  Hematology  (+) anemia ,   Anesthesia Other Findings   Reproductive/Obstetrics (+) Pregnancy PROM                             Anesthesia Physical Anesthesia Plan  ASA: II  Anesthesia Plan: Epidural   Post-op Pain Management:    Induction:   PONV Risk Score and Plan:   Airway Management Planned: Natural Airway  Additional Equipment:   Intra-op Plan:   Post-operative Plan:   Informed Consent: I have reviewed the patients History and Physical, chart, labs and discussed the procedure including the risks, benefits and alternatives for the proposed anesthesia with the patient or authorized representative who has indicated his/her understanding and acceptance.       Plan Discussed with: Anesthesiologist  Anesthesia Plan Comments:         Anesthesia Quick Evaluation  

## 2021-09-18 NOTE — Anesthesia Procedure Notes (Signed)
Epidural Patient location during procedure: OB Start time: 09/18/2021 9:32 PM End time: 09/18/2021 9:49 PM  Staffing Anesthesiologist: Lowella Curb, MD Performed: anesthesiologist   Preanesthetic Checklist Completed: patient identified, IV checked, site marked, risks and benefits discussed, surgical consent, monitors and equipment checked, pre-op evaluation and timeout performed  Epidural Patient position: sitting Prep: ChloraPrep Patient monitoring: heart rate, cardiac monitor, continuous pulse ox and blood pressure Approach: midline Location: L2-L3 Injection technique: LOR saline  Needle:  Needle type: Tuohy  Needle gauge: 17 G Needle length: 9 cm Needle insertion depth: 6 cm Catheter type: closed end flexible Catheter size: 20 Guage Catheter at skin depth: 10 cm Test dose: negative  Assessment Events: blood not aspirated, injection not painful, no injection resistance, no paresthesia and negative IV test  Additional Notes Reason for block:procedure for pain

## 2021-09-18 NOTE — MAU Note (Signed)
Negative fern slide by RN. D/w Cleone Slim CNM, asks RN to check pt cervix and call the labor team. Dr Yetta Barre notified of patient, will call RN back with POC.

## 2021-09-18 NOTE — MAU Note (Signed)
Presents with c/o bright pinkish red vaginal discharge, reports not wearing pad or panty liner.  States had LOF this morning, nothing since.  Endorses +FM.

## 2021-09-18 NOTE — MAU Provider Note (Signed)
S: Doing well, pain from contractions is increasing. She is planning an epidural.    O: Vitals:   09/18/21 1352 09/18/21 1406 09/18/21 1635 09/18/21 1840  BP: 124/78 114/74 130/75 130/71  Pulse: 77 89 72 72  Resp: 19  16 16   Temp: 98.4 F (36.9 C)  98.1 F (36.7 C) 98.2 F (36.8 C)  TempSrc: Oral  Oral Oral  SpO2: 96%  98% 97%  Weight:      Height:         FHT:  FHR: 135 bpm, variability: moderate,  accelerations:  Present,  decelerations:  Present variable UC:   regular, every 2-6 minutes SVE:   Dilation: 5 Effacement (%): 80 Station: -1 Exam by:: [email or web address], CNM   A / P: Spontaneous labor, progressing normally  Fetal Wellbeing:  Category I Pain Control:  Labor support without medications  Anticipated MOD:  NSVD Plan to AROM when admitted to L&D room  Carloyn Jaeger, CNM 09/18/2021, 8:47 PM

## 2021-09-18 NOTE — H&P (Signed)
OBSTETRIC ADMISSION HISTORY AND PHYSICAL  Teresa [name] is a 21 y.o. female G2P1001 with IUP at [redacted]w[redacted]d by [place] presenting for IOL d/t variable decels. She reports +FMs, No LOF, no VB, no blurry vision, headaches or peripheral edema, and RUQ pain.  She plans on breast feeding. She requests depo for birth control. She received her prenatal care at [facility]  Dating: By [place] --->  Estimated Date of Delivery: 09/13/21  Sono:    @[redacted]w[redacted]d , CWD, normal anatomy, cephalic presentation, fundal placenta, 2521g, 56% EFW   Prenatal History/Complications:  -Obesity, daily bASA and serial growth with MFM -Insufficient antepartum care, about 5 prenatal visits since August 2022  Past Medical History: Past Medical History:  Diagnosis Date   Anemia    Family history of adverse reaction to anesthesia    lidocaine does not work on her mother    Past Surgical History: Past Surgical History:  Procedure Laterality Date   NO PAST SURGERIES      Obstetrical History: OB History     Gravida  2   Para  1   Term  1   Preterm      AB      Living  1      SAB      IAB      Ectopic      Multiple  0   Live Births  1           Social History Social History   Socioeconomic History   Marital status: Single    Spouse name: Not on file   Number of children: 1   Years of education: Not on file   Highest education level: Associate degree: occupational, September 2022, or vocational program  Occupational History    Comment: Scientist, product/process development  Tobacco Use   Smoking status: Never    Passive exposure: Never   Smokeless tobacco: Never  Vaping Use   Vaping Use: Never used  Substance and Sexual Activity   Alcohol use: Not Currently   Drug use: Never   Sexual activity: Yes  Other Topics Concern   Not on file  Social History Narrative   Late to care   Social Determinants of Health   Financial Resource Strain: Not on file  Food Insecurity: Not on file  Transportation Needs: Not  on file  Physical Activity: Not on file  Stress: Not on file  Social Connections: Not on file    Family History: Family History  Problem Relation Age of Onset   Diabetes Father     Allergies: No Known Allergies  Medications Prior to Admission  Medication Sig Dispense Refill Last Dose   ascorbic acid (VITAMIN C) 500 MG tablet Take 1 tablet (500 mg total) by mouth every other day. 30 tablet 3    aspirin 81 MG chewable tablet Chew 1 tablet (81 mg total) by mouth daily. (Patient not taking: Reported on 08/05/2021) 30 tablet 6    Blood Pressure Monitoring (BLOOD PRESSURE MONITOR AUTOMAT) DEVI 1 Device by Does not apply route daily. Automatic blood pressure cuff regular size. To monitor blood pressure regularly at home. ICD-10 code:Z34.90 (Patient not taking: Reported on 08/13/2021) 1 each 0    ferrous sulfate (FERROUSUL) 325 (65 FE) MG tablet Take 1 tablet (325 mg total) by mouth every other day. Take with Vitamin C tablet 30 tablet 3    Misc. Devices (GOJJI WEIGHT SCALE) MISC 1 Device by Does not apply route daily as needed. To weight self daily as  needed at home. ICD-10 code: Z34.90 (Patient not taking: Reported on 08/13/2021) 1 each 0    Prenatal Vit w/Fe-Methylfol-FA (PNV PO) Take by mouth.        Review of Systems   All systems reviewed and negative except as stated in HPI  Blood pressure 130/71, pulse 72, temperature 98.2 F (36.8 C), temperature source Oral, resp. rate 16, height 5\' 7"  (1.702 m), weight 113.9 kg, last menstrual period 08/05/2020, SpO2 97 %, currently breastfeeding. General appearance: alert, cooperative, and no distress Lungs: clear to auscultation bilaterally Heart: regular rate and rhythm Abdomen: soft, non-tender; bowel sounds normal Pelvic: adequate Extremities: Homans sign is negative, no sign of DVT DTR's brisk Presentation: cephalic Fetal monitoringBaseline: 150 bpm, Variability: Good {> 6 bpm), Accelerations: Reactive, and Decelerations: Variable:  moderate Uterine activity: ctx irregular Dilation: 5 Effacement (%): 80 Station: -1 Exam by:: [email or web address], CNM   Prenatal labs: ABO, Rh: --/--/A POS (01/20 1459) Antibody: NEG (01/20 1459) Rubella: 1.79 (08/04 0954) RPR: Non Reactive (10/27 0825)  HBsAg: Negative (08/04 0954)  HIV: Non Reactive (10/27 0825)  GBS: Negative/-- (12/29 0921)  2 hr Glucola normal Genetic screening  NIPS, AFP, and Horizon all negative Anatomy 04-24-2005 normal  Prenatal Transfer Tool  Maternal Diabetes: No Genetic Screening: Normal Maternal Ultrasounds/Referrals: Normal Fetal Ultrasounds or other Referrals:  Referred to Materal Fetal Medicine  Maternal Substance Abuse:  No Significant Maternal Medications:  None Significant Maternal Lab Results: None  Results for orders placed or performed during the hospital encounter of 09/18/21 (from the past 24 hour(s))  Resp Panel by RT-PCR (Flu A&B, Covid) Nasopharyngeal Swab   Collection Time: 09/18/21  2:50 PM   Specimen: Nasopharyngeal Swab; Nasopharyngeal(NP) swabs in vial transport medium  Result Value Ref Range   SARS Coronavirus 2 by RT PCR NEGATIVE NEGATIVE   Influenza A by PCR NEGATIVE NEGATIVE   Influenza B by PCR NEGATIVE NEGATIVE  CBC   Collection Time: 09/18/21  2:59 PM  Result Value Ref Range   WBC 9.3 4.0 - 10.5 K/uL   RBC 3.37 (L) 3.87 - 5.11 MIL/uL   Hemoglobin 10.3 (L) 12.0 - 15.0 g/dL   HCT 09/20/21 (L) 38.1 - 01.7 %   MCV 89.6 80.0 - 100.0 fL   MCH 30.6 26.0 - 34.0 pg   MCHC 34.1 30.0 - 36.0 g/dL   RDW 51.0 25.8 - 52.7 %   Platelets 222 150 - 400 K/uL   nRBC 0.0 0.0 - 0.2 %  Type and screen   Collection Time: 09/18/21  2:59 PM  Result Value Ref Range   ABO/RH(D) A POS    Antibody Screen NEG    Sample Expiration      09/21/2021,2359 Performed at [facility], [address], [place], [place]     Patient Active Problem List   Diagnosis Date Noted   Post term pregnancy at [redacted] weeks gestation 09/18/2021   Anemia during  pregnancy 06/30/2021   Obesity affecting pregnancy in third trimester 06/25/2021   Supervision of other normal pregnancy, antepartum 04/02/2021    Assessment/Plan:  NIZHONI PARLOW is a 21 y.o. G2P1001 at [redacted]w[redacted]d here for IOL d/t recurrent variable decels   #Labor:SOL #Pain: Planning epidural #FWB: Category 1 #ID: N/A #MOF: breast #MOC:Depo #Circ:  No  [redacted]w[redacted]d, DO  09/18/2021, 3:43 PM  Attestation of Supervision of Student:  I confirm that I have verified the information documented in the  resident s note and that I have also personally  reperformed the history, physical exam and all medical decision making activities.  I have verified that all services and findings are accurately documented in this student's note; and I agree with management and plan as outlined in the documentation. I have also made any necessary editorial changes.   Raelyn Moraolitta Kanda Deluna, CNM [facility], [facility] 09/18/2021 9:24 PM

## 2021-09-18 NOTE — Lactation Note (Signed)
This note was copied from a baby's chart. Lactation Consultation Note  Patient Name: Teresa [name] [id number] Date: 09/18/2021 Reason for consult: L&D Initial assessment Age:23 hours LC entered the room, mom was doing skin to skin with infant. Mom latched infant on her right breast using the football hold position, infant latched with depth and was still breastfeeding after 12 minutes when LC left the room. Mom knows to breastfeed infant according to primal cues, skin to skin. Mom knows to call RN/LC if she would like further assistance with latching infant at the breast. LC congratulated family at the birth of their son.  Maternal Data Has patient been taught Hand Expression?: Yes Does the patient have breastfeeding experience prior to this delivery?: Yes How long did the patient breastfeed?: Per mom, she BF her 69 month old son for 6 months until he got teeth.  Feeding Mother's Current Feeding Choice: Breast Milk and Formula  LATCH Score Latch: Grasps breast easily, tongue down, lips flanged, rhythmical sucking.  Audible Swallowing: Spontaneous and intermittent  Type of Nipple: Everted at rest and after stimulation  Comfort (Breast/Nipple): Soft / non-tender  Hold (Positioning): Assistance needed to correctly position infant at breast and maintain latch.  LATCH Score: 9   Lactation Tools Discussed/Used    Interventions Interventions: Assisted with latch;Skin to skin;Breast compression;Adjust position;Support pillows;Position options;Education  Discharge    Consult Status Consult Status: Follow-up from L&D    Danelle Earthly 09/18/2021, 11:00 PM

## 2021-09-18 NOTE — MAU Note (Signed)
D/w Dr Annia Friendly, pt requesting something to eat. Okay for her to have something light to eat per Dr Annia Friendly. Pt informed.

## 2021-09-19 ENCOUNTER — Encounter (HOSPITAL_COMMUNITY): Payer: Self-pay | Admitting: Family Medicine

## 2021-09-19 LAB — RPR: RPR Ser Ql: NONREACTIVE

## 2021-09-19 MED ORDER — COCONUT OIL OIL: 1.0000 "application " | TOPICAL_OIL | Status: DC | PRN

## 2021-09-19 MED ORDER — ACETAMINOPHEN 325 MG PO TABS: 650.0000 mg | ORAL_TABLET | ORAL | Status: DC | PRN

## 2021-09-19 MED ORDER — IBUPROFEN 600 MG PO TABS
600.0000 mg | ORAL_TABLET | Freq: Four times a day (QID) | ORAL | Status: DC
  Administered 2021-09-19 – 2021-09-20 (×6): 600 mg via ORAL
  Filled 2021-09-19 (×6): qty 1

## 2021-09-19 MED ORDER — FERROUS SULFATE 325 (65 FE) MG PO TABS
325.0000 mg | ORAL_TABLET | ORAL | Status: DC
  Administered 2021-09-19: 325 mg via ORAL
  Filled 2021-09-19: qty 1

## 2021-09-19 MED ORDER — DIPHENHYDRAMINE HCL 25 MG PO CAPS: 25.0000 mg | ORAL_CAPSULE | Freq: Four times a day (QID) | ORAL | Status: DC | PRN

## 2021-09-19 MED ORDER — IBUPROFEN 600 MG PO TABS: 600.0000 mg | ORAL_TABLET | Freq: Four times a day (QID) | ORAL | 0 refills | Status: DC

## 2021-09-19 MED ORDER — ONDANSETRON HCL 4 MG/2ML IJ SOLN: 4.0000 mg | INTRAMUSCULAR | Status: DC | PRN

## 2021-09-19 MED ORDER — PRENATAL MULTIVITAMIN CH
1.0000 | ORAL_TABLET | Freq: Every day | ORAL | Status: DC
  Administered 2021-09-19: 1 via ORAL
  Filled 2021-09-19: qty 1

## 2021-09-19 MED ORDER — BENZOCAINE-MENTHOL 20-0.5 % EX AERO: 1.0000 "application " | INHALATION_SPRAY | CUTANEOUS | Status: DC | PRN

## 2021-09-19 MED ORDER — DIBUCAINE (PERIANAL) 1 % EX OINT: 1.0000 "application " | TOPICAL_OINTMENT | CUTANEOUS | Status: DC | PRN

## 2021-09-19 MED ORDER — SENNOSIDES-DOCUSATE SODIUM 8.6-50 MG PO TABS
2.0000 | ORAL_TABLET | Freq: Every day | ORAL | Status: DC
  Administered 2021-09-19 – 2021-09-20 (×2): 2 via ORAL
  Filled 2021-09-19 (×2): qty 2

## 2021-09-19 MED ORDER — ONDANSETRON HCL 4 MG PO TABS: 4.0000 mg | ORAL_TABLET | ORAL | Status: DC | PRN

## 2021-09-19 MED ORDER — SIMETHICONE 80 MG PO CHEW: 80.0000 mg | CHEWABLE_TABLET | ORAL | Status: DC | PRN

## 2021-09-19 MED ORDER — WITCH HAZEL-GLYCERIN EX PADS: 1.0000 "application " | MEDICATED_PAD | CUTANEOUS | Status: DC | PRN

## 2021-09-19 MED ORDER — TETANUS-DIPHTH-ACELL PERTUSSIS 5-2.5-18.5 LF-MCG/0.5 IM SUSY: 0.5000 mL | PREFILLED_SYRINGE | Freq: Once | INTRAMUSCULAR | Status: DC

## 2021-09-19 MED ORDER — ZOLPIDEM TARTRATE 5 MG PO TABS: 5.0000 mg | ORAL_TABLET | Freq: Every evening | ORAL | Status: DC | PRN

## 2021-09-19 MED ORDER — ASCORBIC ACID 500 MG PO TABS
500.0000 mg | ORAL_TABLET | ORAL | Status: DC
  Administered 2021-09-19: 500 mg via ORAL
  Filled 2021-09-19 (×2): qty 1

## 2021-09-19 NOTE — Discharge Summary (Signed)
Postpartum Discharge Summary     Patient Name: Teresa [name] DOB: 08-18-2001 MRN: [id number]  Date of admission: 09/18/2021 Delivery date:09/18/2021  Delivering provider: Laury Deep  Date of discharge: 09/20/2021  Admitting diagnosis: Post term pregnancy at [redacted] weeks gestation [O48.0, Z3A.41] Intrauterine pregnancy: [redacted]w[redacted]d    Secondary diagnosis:  Principal Problem:   Post term pregnancy at 418weeks gestation  Additional problems: none    Discharge diagnosis:  Post Term Pregnancy delivered                                               Post partum procedures: Cytotec, Pitocin and TXA immediately following delivery Augmentation: N/A Complications: None  Hospital course: Onset of Labor With Vaginal Delivery      21y.o. yo G2P1001 at 448w6das admitted in Active Labor on 09/18/2021. Patient had an uncomplicated labor course as follows:  Membrane Rupture Time/Date: 10:03 PM ,09/18/2021   Delivery Method:Vaginal, Spontaneous  Episiotomy: None  Lacerations:  None  Patient had an uncomplicated postpartum course.  She is ambulating, tolerating a regular diet, passing flatus, and urinating well. Patient is discharged home in stable condition on 09/20/21.  Newborn Data: Birth date:09/18/2021  Birth time:10:05 PM  Gender:Female  Living status:Living  Apgars:9 ,10  Weight:3490 g   Magnesium Sulfate received: No BMZ received: No Rhophylac:N/A MMR:N/A T-DaP: declined Flu: N/A- declined Transfusion:No  Physical exam  Vitals:   09/19/21 0920 09/19/21 1324 09/19/21 1955 09/20/21 0536  BP: 110/68 120/68 105/68 113/66  Pulse: 73 95 88 76  Resp: 18 16 18 18   Temp: 98 F (36.7 C) 98.7 F (37.1 C) 98 F (36.7 C) 98 F (36.7 C)  TempSrc: Oral Oral Oral Oral  SpO2: 100% 100% 100% 99%  Weight:      Height:       General: alert, cooperative, and no distress Lochia: appropriate Uterine Fundus: firm Incision: Healing well with no significant drainage, No significant  erythema, Dressing is clean, dry, and intact DVT Evaluation: No evidence of DVT seen on physical exam. Negative Homan's sign. No cords or calf tenderness. No significant calf/ankle edema. Labs: Lab Results  Component Value Date   WBC 9.3 09/18/2021   HGB 10.3 (L) 09/18/2021   HCT 30.2 (L) 09/18/2021   MCV 89.6 09/18/2021   PLT 222 09/18/2021   No flowsheet data found. Edinburgh Score: Edinburgh Postnatal Depression Scale Screening Tool 08/14/2020  I have been able to laugh and see the funny side of things. 0  I have looked forward with enjoyment to things. 0  I have blamed myself unnecessarily when things went wrong. 0  I have been anxious or worried for no good reason. 0  I have felt scared or panicky for no good reason. 0  Things have been getting on top of me. 0  I have been so unhappy that I have had difficulty sleeping. 0  I have felt sad or miserable. 0  I have been so unhappy that I have been crying. 0  The thought of harming myself has occurred to me. 0  Edinburgh Postnatal Depression Scale Total 0     After visit meds:  Allergies as of 09/20/2021   No Known Allergies      Medication List     STOP taking these medications    aspirin 81 MG chewable tablet  TAKE these medications    ascorbic acid 500 MG tablet Commonly known as: VITAMIN C Take 1 tablet (500 mg total) by mouth every other day.   Blood Pressure Monitor Automat Devi 1 Device by Does not apply route daily. Automatic blood pressure cuff regular size. To monitor blood pressure regularly at home. ICD-10 code:Z34.90   ferrous sulfate 325 (65 FE) MG tablet Commonly known as: FerrouSul Take 1 tablet (325 mg total) by mouth every other day. Take with Vitamin C tablet   Gojji Weight Scale Misc 1 Device by Does not apply route daily as needed. To weight self daily as needed at home. ICD-10 code: Z34.90   ibuprofen 600 MG tablet Commonly known as: ADVIL Take 1 tablet (600 mg total) by  mouth every 6 (six) hours.   PNV PO Take by mouth.         Discharge home in stable condition Infant Feeding: Breast Infant Disposition:home with mother Discharge instruction: per After Visit Summary and Postpartum booklet. Activity: Advance as tolerated. Pelvic rest for 6 weeks.  Diet: routine diet Future Appointments: Future Appointments  Date Time Provider [place]  10/28/2021  9:35 AM Gabriel Carina, CNM [facility] None   Follow up Visit:  Follow-up Information     [facility]. Go on 10/28/2021.   Specialty: Obstetrics and Gynecology Why: postpartum visit Contact information: [address] [place] [phone]                Message sent to [facility] by R. Renato Battles, CNM on 09/18/21 Please schedule this patient for a Virtual postpartum visit in 6 weeks with the following provider: Any provider. Additional Postpartum F/U: none   Low risk pregnancy complicated by:  none Delivery mode:  Vaginal, Spontaneous  Anticipated Birth Control:  Depo   09/20/2021 Laury Deep, CNM

## 2021-09-19 NOTE — Progress Notes (Signed)
Post Partum Day 1 Subjective: Teresa [name]  is a 21 y.o. H6D1497 s/p SVD at [redacted]w[redacted]d.  She reports she is doing well. No acute events overnight. She denies any problems with ambulating, voiding or po intake. Denies nausea or vomiting.  Pain is well controlled on ibuprofen.  Lochia is moderate and improving.   Objective: Blood pressure 116/64, pulse 75, temperature 98.4 F (36.9 C), temperature source Oral, resp. rate 16, height 5\' 7"  (1.702 m), weight 113.9 kg, last menstrual period 08/05/2020, SpO2 100 %, unknown if currently breastfeeding.  Physical Exam:  General: alert, cooperative, fatigued, and no distress Lochia: appropriate Uterine Fundus: firm Incision: n/a DVT Evaluation: No evidence of DVT seen on physical exam. Negative Homan's sign. No cords or calf tenderness. No significant calf/ankle edema.  Recent Labs    09/18/21 1459  HGB 10.3*  HCT 30.2*    Assessment/Plan: Plan for discharge tomorrow, Breastfeeding, and Circumcision prior to discharge   LOS: 1 day   09/20/21, CNM 09/19/2021, 7:15 AM

## 2021-09-19 NOTE — Anesthesia Postprocedure Evaluation (Signed)
Anesthesia Post Note  Patient: Teresa [name]  Procedure(s) Performed: AN AD HOC LABOR EPIDURAL     Patient location during evaluation: Mother Baby Anesthesia Type: Epidural Level of consciousness: awake and alert Pain management: pain level controlled Vital Signs Assessment: post-procedure vital signs reviewed and stable Respiratory status: spontaneous breathing, nonlabored ventilation and respiratory function stable Cardiovascular status: stable Postop Assessment: no headache, no backache and epidural receding Anesthetic complications: no   No notable events documented.  Last Vitals:  Vitals:   09/19/21 0104 09/19/21 0506  BP: 115/69 116/64  Pulse: 87 75  Resp: 16 16  Temp: 37.1 C 36.9 C  SpO2: 100% 100%    Last Pain:  Vitals:   09/19/21 0506  TempSrc: Oral  PainSc: 2    Pain Goal:                   [name]

## 2021-09-19 NOTE — Lactation Note (Signed)
This note was copied from a baby's chart. Lactation Consultation Note  Patient Name: Teresa [name] [id number] Date: 09/19/2021 Reason for consult: Follow-up assessment;Mother's request;Term;Breastfeeding assistance Age:23 hours Mom feeding plant breast and bottle supplementation with formula.  Mom recent feeding at  12 pm for 15 min, infant resting comfortably during the visit.  LC reviewed feeding cues, latching positions and signs of milk transfer.  Mom denied any pain with latch, nipples are round with no signs of compression or nipple trauma.    Plan 1. To feed based on cues 8-12x 24hr period.  2. Mom to offer breasts and look for signs of milk transfer.  3. Mom to supplement with EBM first followed by formula with pace bottle feeding and slow flow nipple. BF supplementation guide provided.  4. I and O sheet reviewed.   All questions answered at the end of the visit.  Maternal Data Has patient been taught Hand Expression?: Yes Does the patient have breastfeeding experience prior to this delivery?: Yes How long did the patient breastfeed?: 14 months and 6 months  Feeding Mother's Current Feeding Choice: Breast Milk and Formula  LATCH Score                    Lactation Tools Discussed/Used    Interventions Interventions: Breast feeding basics reviewed;Skin to skin;Breast massage;Hand express;Breast compression;Support pillows;Position options;Expressed milk;Education;Pace feeding;LC Psychologist, educational;Infant Driven Feeding Algorithm education  Discharge Pump: Manual WIC Program: Yes  Consult Status Consult Status: Follow-up Date: 09/20/21 Follow-up type: In-patient    Rashi Granier  Nicholson-Springer 09/19/2021, 1:38 PM

## 2021-09-20 ENCOUNTER — Inpatient Hospital Stay (HOSPITAL_COMMUNITY): Admission: AD | Admit: 2021-09-20 | Payer: Medicaid Other | Source: Home / Self Care | Admitting: Family Medicine

## 2021-09-20 ENCOUNTER — Inpatient Hospital Stay (HOSPITAL_COMMUNITY): Payer: Medicaid Other

## 2021-09-20 DIAGNOSIS — O99019 Anemia complicating pregnancy, unspecified trimester: Secondary | ICD-10-CM

## 2021-09-20 DIAGNOSIS — Z3480 Encounter for supervision of other normal pregnancy, unspecified trimester: Secondary | ICD-10-CM

## 2021-09-20 HISTORY — DX: Anemia, unspecified: D64.9

## 2021-09-20 MED ORDER — MEDROXYPROGESTERONE ACETATE 150 MG/ML IM SUSP
150.0000 mg | Freq: Once | INTRAMUSCULAR | Status: AC
  Administered 2021-09-20: 150 mg via INTRAMUSCULAR
  Filled 2021-09-20: qty 1

## 2021-09-20 NOTE — Lactation Note (Signed)
This note was copied from a baby's chart. Lactation Consultation Note  Patient Name: Teresa [name] Date: 09/20/2021 Reason for consult: Follow-up assessment;Term Age:23 hours  LC in to visit with P2 Mom of term baby on day of discharge.  Baby at 5% weight loss.  Mom choosing to breast and formula feed baby.  Mom offering the breast first before supplementing.  Mom has a DEBP at home.  Baby just finished feeding for 20 mins.  Showing some subtle cues and asked Mom to latch baby on 2nd breast.  Mom latched baby easily.  LC placed pillow support under baby and assisted Mom to switch to cross cradle hold from cradle.  Mom very receptive to teaching.  Encouraged STS with baby, and offering the breast often with cues.  Mom aware of OP lactation support available to her.   Feeding Nipple Type: Slow - flow  LATCH Score Latch: Grasps breast easily, tongue down, lips flanged, rhythmical sucking.  Audible Swallowing: Spontaneous and intermittent  Type of Nipple: Everted at rest and after stimulation  Comfort (Breast/Nipple): Soft / non-tender  Hold (Positioning): Assistance needed to correctly position infant at breast and maintain latch.  LATCH Score: 9  Interventions Interventions: Breast feeding basics reviewed;Assisted with latch;Skin to skin;Adjust position;Support pillows;Position options;Expressed milk  Discharge Discharge Education: Engorgement and breast care;Warning signs for feeding baby Pump: Personal  Consult Status Consult Status: Complete Date: 09/20/21 Follow-up type: Call as needed    Teresa [name] 09/20/2021, 11:26 AM

## 2021-09-21 ENCOUNTER — Telehealth: Payer: Self-pay

## 2021-09-21 NOTE — Telephone Encounter (Signed)
Transition Care Management Follow-up Telephone Call Date of discharge and from where: 09/20/2021-[facility] How have you been since you were released from the hospital? Patient stated she is doing fine.  Any questions or concerns? No  Items Reviewed: Did the pt receive and understand the discharge instructions provided? Yes  Medications obtained and verified? Yes  Other? No  Any new allergies since your discharge? No  Dietary orders reviewed? No Do you have support at home? Yes   Home Care and Equipment/Supplies: Were home health services ordered? not applicable If so, what is the name of the agency? N/A  Has the agency set up a time to come to the patient's home? not applicable Were any new equipment or medical supplies ordered?  No What is the name of the medical supply agency? N/A Were you able to get the supplies/equipment? not applicable Do you have any questions related to the use of the equipment or supplies? No  Functional Questionnaire: (I = Independent and D = Dependent) ADLs: I  Bathing/Dressing- I  Meal Prep- I  Eating- I  Maintaining continence- I  Transferring/Ambulation- I  Managing Meds- I  Follow up appointments reviewed:  PCP Hospital f/u appt confirmed? No   Specialist Hospital f/u appt confirmed? Yes  Scheduled to see OBGYN on 09/25/2021 @ 9:35. Are transportation arrangements needed? No  If their condition worsens, is the pt aware to call PCP or go to the Emergency Dept.? Yes Was the patient provided with contact information for the PCP's office or ED? Yes Was to pt encouraged to call back with questions or concerns? Yes

## 2021-10-03 ENCOUNTER — Telehealth (HOSPITAL_COMMUNITY): Payer: Self-pay | Admitting: *Deleted

## 2021-10-03 NOTE — Telephone Encounter (Signed)
Attempted hospital discharge follow-up call. Left message for patient to return RN call. Deforest Hoyles, RN, 10/03/21, [phone]

## 2021-10-28 ENCOUNTER — Ambulatory Visit (INDEPENDENT_AMBULATORY_CARE_PROVIDER_SITE_OTHER): Payer: Medicaid Other | Admitting: Certified Nurse Midwife

## 2021-10-28 ENCOUNTER — Other Ambulatory Visit: Payer: Self-pay

## 2021-10-28 ENCOUNTER — Encounter: Payer: Self-pay | Admitting: Certified Nurse Midwife

## 2021-10-28 DIAGNOSIS — Z0289 Encounter for other administrative examinations: Secondary | ICD-10-CM

## 2021-10-28 NOTE — Progress Notes (Signed)
? ? ?Postpartum Visit Note ? ?Teresa [name] is a 21 y.o. G70P2002 female who presents for a postpartum visit. She is 6 weeks postpartum following a normal spontaneous vaginal delivery.  I have fully reviewed the prenatal and intrapartum course. The delivery was at [redacted]w[redacted]d gestational weeks.  Anesthesia: epidural. Postpartum course has been uncomplicated. Baby is doing well. Baby is feeding by breast. Bleeding no bleeding. Bowel function is normal. Bladder function is normal. Patient is not sexually active. Contraception method is Depo-Provera injections. Postpartum depression screening: negative. ? ? Edinburgh Postnatal Depression Scale - 10/28/21 0854   ? ?  ? Edinburgh Postnatal Depression Scale:  In the Past 7 Days  ? I have been able to laugh and see the funny side of things. 0   ? I have looked forward with enjoyment to things. 0   ? I have blamed myself unnecessarily when things went wrong. 0   ? I have been anxious or worried for no good reason. 0   ? I have felt scared or panicky for no good reason. 0   ? Things have been getting on top of me. 0   ? I have been so unhappy that I have had difficulty sleeping. 0   ? I have felt sad or miserable. 0   ? I have been so unhappy that I have been crying. 0   ? The thought of harming myself has occurred to me. 0   ? Edinburgh Postnatal Depression Scale Total 0   ? ?  ?  ? ?  ? ?Health Maintenance Due  ?Topic Date Due  ? COVID-19 Vaccine (1) Never done  ? HPV VACCINES (1 - 2-dose series) Never done  ? ?The following portions of the patient's history were reviewed and updated as appropriate: allergies, current medications, past family history, past medical history, past social history, past surgical history, and problem list. ? ?Review of Systems ?Pertinent items noted in HPI and remainder of comprehensive ROS otherwise negative. ? ?Objective:  ?BP 108/74   Pulse 84   Ht 5\' 7"  (1.702 m)   Wt 235 lb 6.4 oz (106.8 kg)   LMP 08/05/2020   Breastfeeding Yes   BMI  36.87 kg/m?   ? ?General:  alert, cooperative, appears stated age, and no distress  ? Breasts:  normal  ?Lungs: Normal effort, no problems with respiration noted  ?Heart:  regular rate and rhythm  ?Abdomen: Not indicated    ?Wound N/A  ?GU exam:  not indicated  ?     ?Assessment:  ?Postpartum care and examination of care ? ?Plan:  ? ?Essential components of care per ACOG recommendations: ? ?1.  Mood and well being: Patient with negative depression screening today. Reviewed local resources for support.  ?- Patient tobacco use? No.   ?- hx of drug use? No.   ? ?2. Infant care and feeding:  ?-Patient currently breastmilk feeding? Yes. Reviewed importance of draining breast regularly to support lactation.  ?-Social determinants of health (SDOH) reviewed in EPIC. No concerns ? ?3. Sexuality, contraception and birth spacing ?- Patient does not want a pregnancy in the next year.  Desired family size is 2 children.  ?- Reviewed forms of contraception in tiered fashion. Patient desired Depo-Provera today (will schedule next visit and send prescription to pharmacy).   ?- Discussed birth spacing of 18 months ? ?4. Sleep and fatigue ?-Encouraged family/partner/community support of 4 hrs of uninterrupted sleep to help with mood and fatigue ? ?5. Physical  Recovery  ?- Discussed patients delivery and complications. She describes her labor as good. ?- Patient had a Vaginal, no problems at delivery. Patient had  no  laceration. Perineal healing reviewed. Patient expressed understanding ?- Patient has urinary incontinence? No. ?- Patient is safe to resume physical and sexual activity ? ?6.  Health Maintenance ?- HM due items addressed Yes ?- Last pap smear  ?Diagnosis  ?Date Value Ref Range Status  ?04/30/2020   Final  ? - Negative for intraepithelial lesion or malignancy (NILM)  ? Pap smear not done at today's visit.  ?-Breast Cancer screening indicated? No.  ? ?7. Chronic Disease/Pregnancy Condition follow up: None ?- PCP follow up  as needed ? ?Edd Arbour, CNM, MSN, IBCLC ?Certified Nurse Midwife, [facility] ? ?  ?

## 2021-11-14 IMAGING — US US MFM OB DETAIL+14 WK
1 series · 14 of 28 positions shown · non-contrast
Comparison: none

[Series 1: us mfm ob detail+14 wk · 114 acquisitions, 14 frames shown]
[im 5/114]
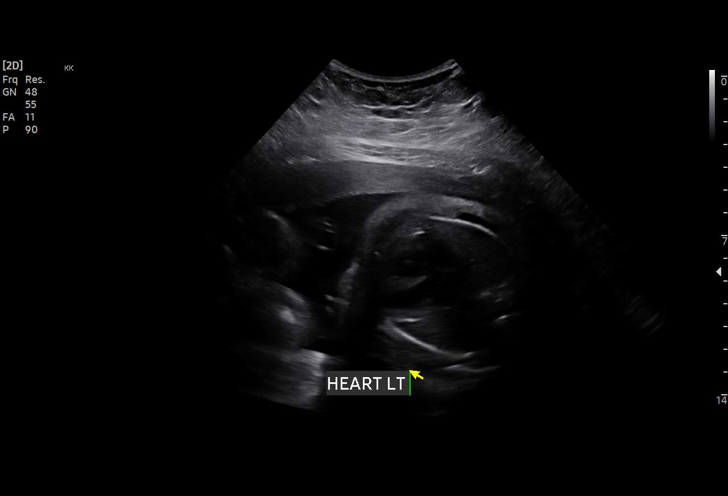
[im 13/114]
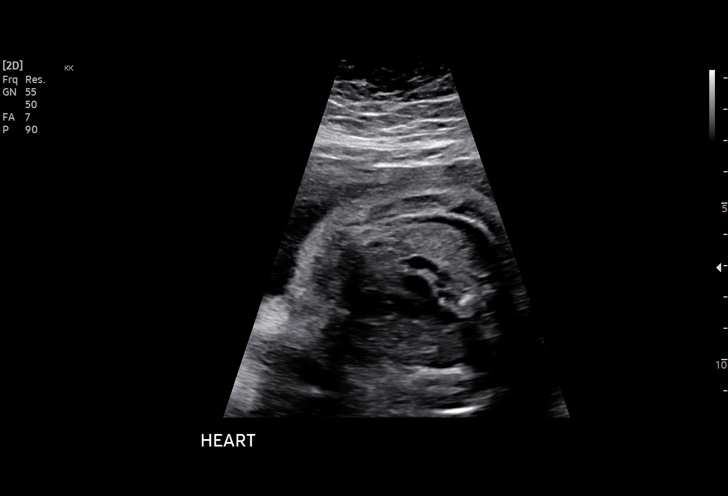
[im 21/114]
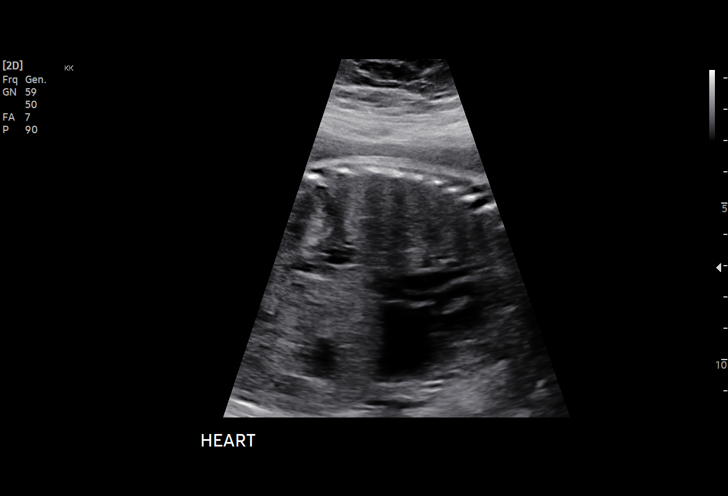
[im 30/114]
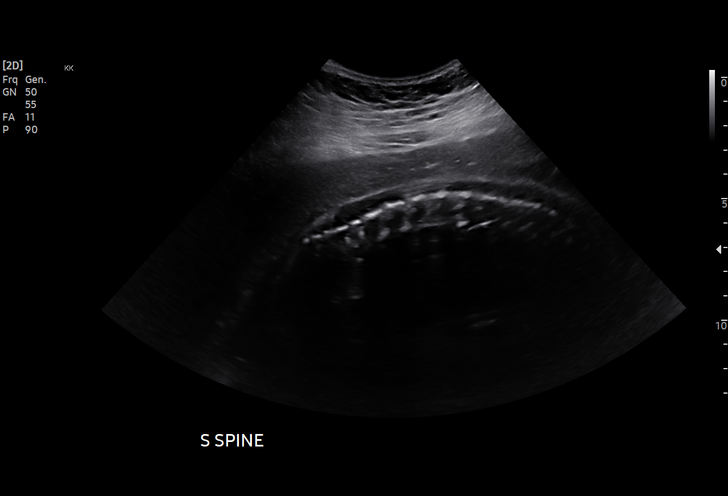
[im 38/114]
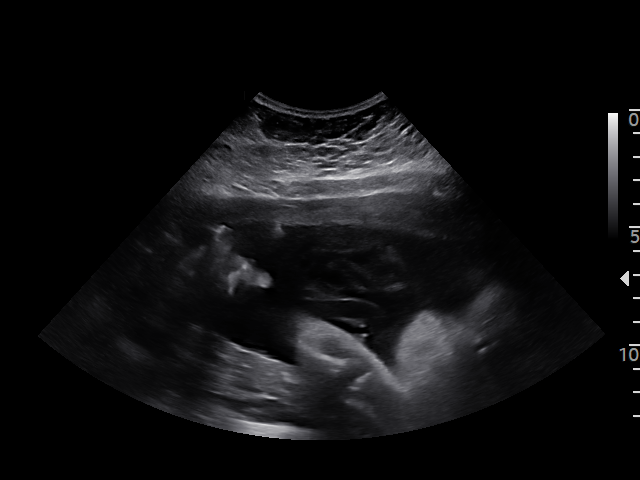
[im 47/114]
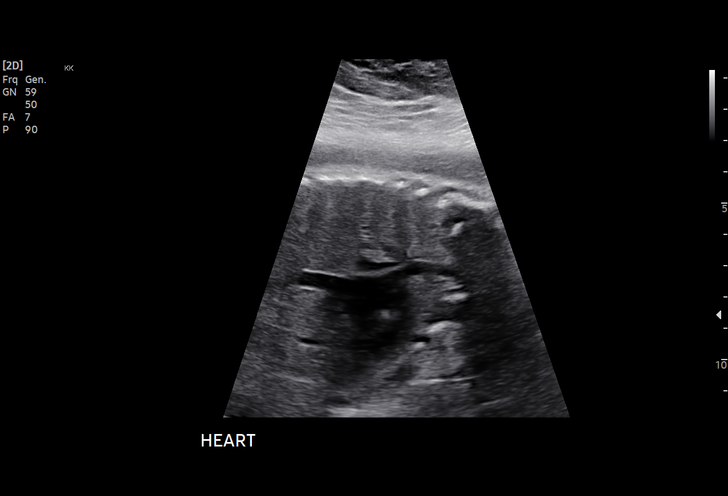
[im 55/114]
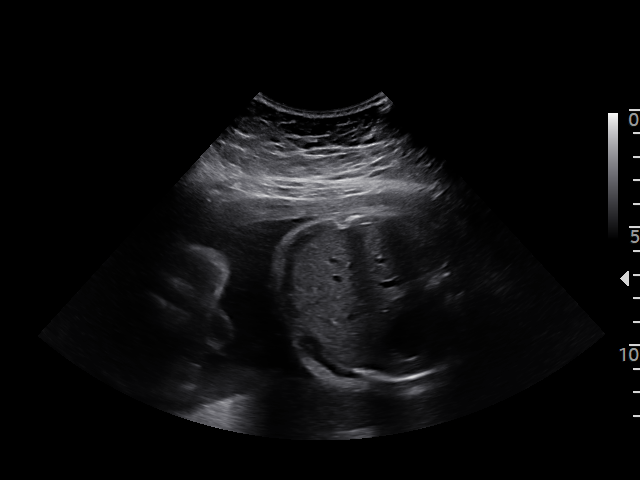
[im 63/114]
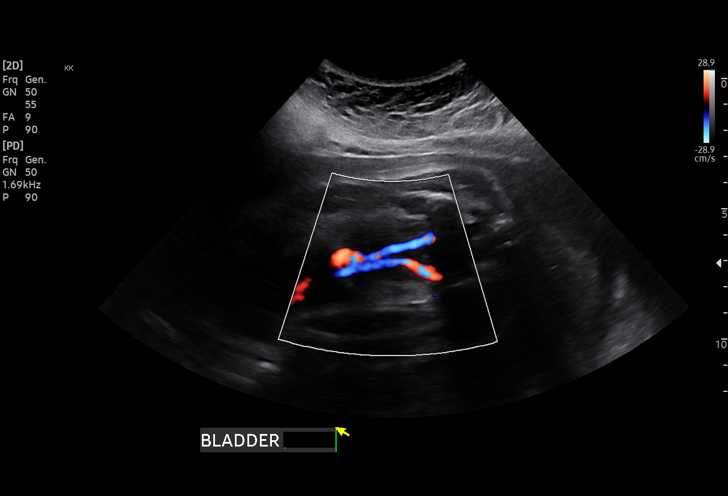
[im 72/114]
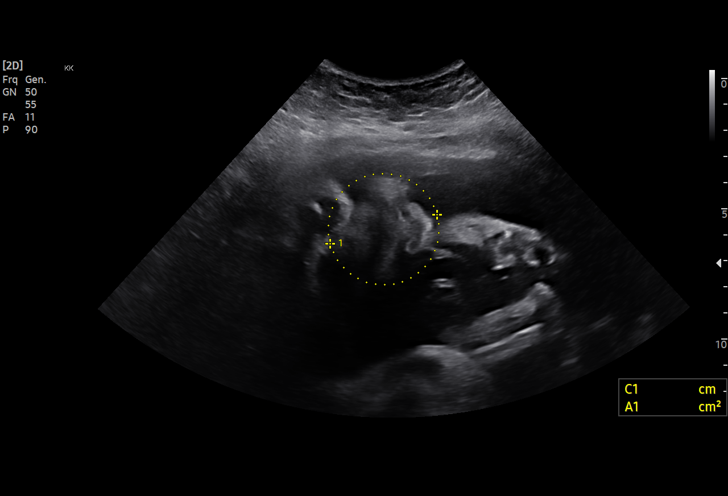
[im 80/114]
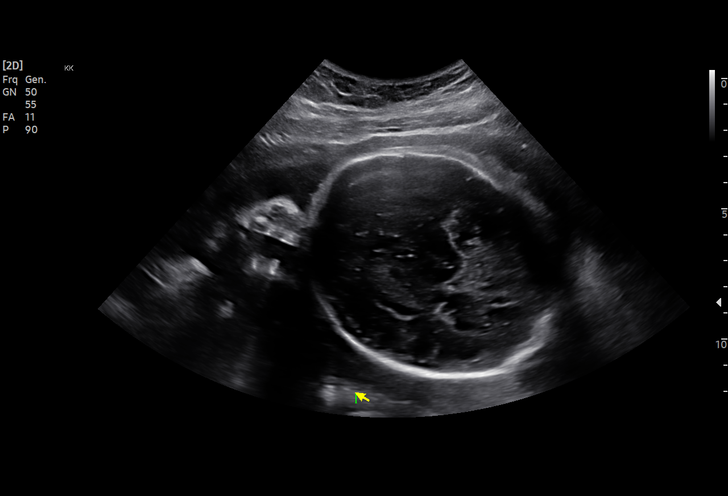
[im 88/114]
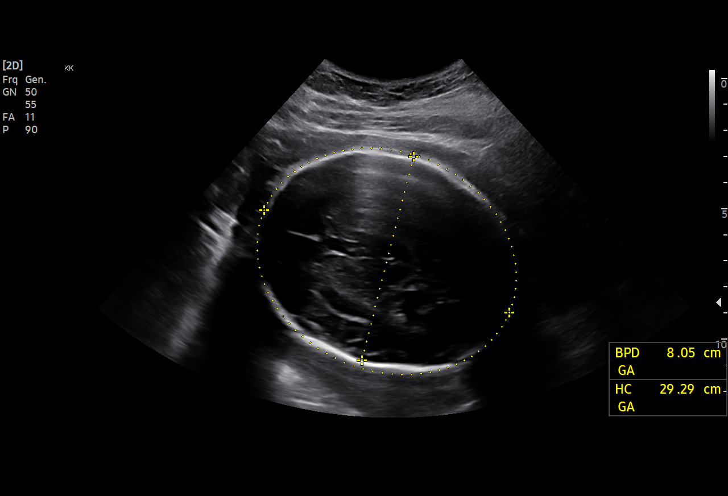
[im 97/114]
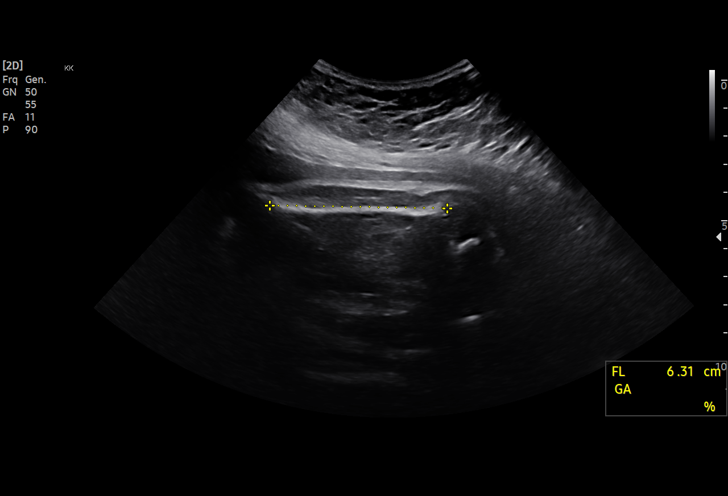
[im 105/114]
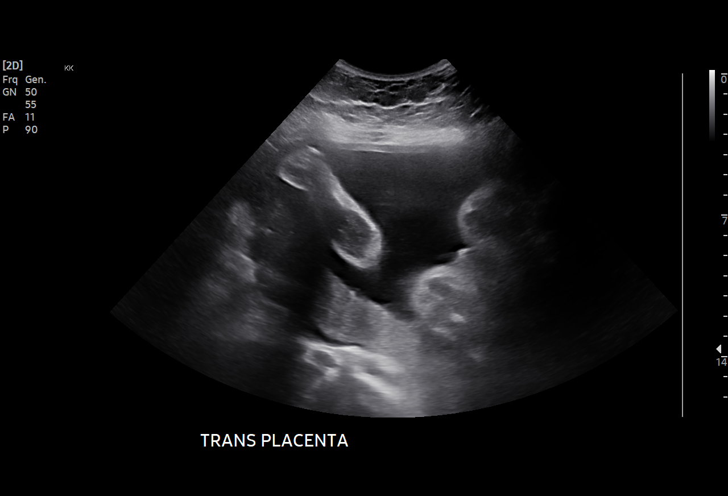
[im 114/114]
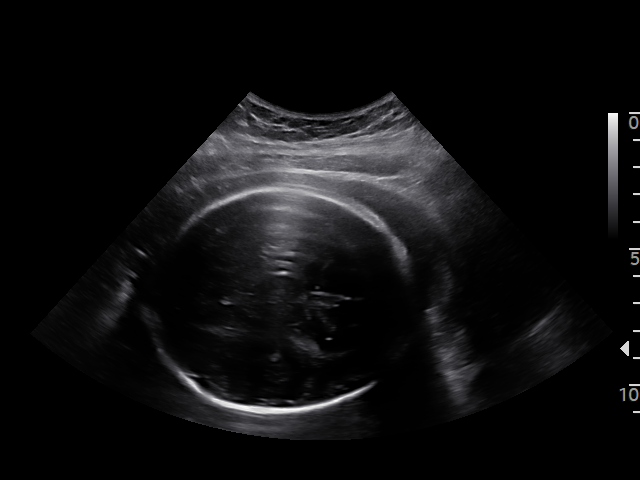

[14 of 28 positions shown; findings below may reference images not displayed]

Name:       NISHIO BUCKERIDGE              Visit Date: 05/02/2020 [REDACTED]

Indications

 Late to prenatal care, third trimester
 31 weeks gestation of pregnancy
 Encounter for antenatal screening for
 malformations
Fetal Evaluation

 Num Of Fetuses:         1
 Fetal Heart Rate(bpm):  147
 Cardiac Activity:       Observed
 Presentation:           Cephalic
 Placenta:               Posterior
 P. Cord Insertion:      Not well visualized

 Amniotic Fluid
 AFI FV:      Within normal limits

 AFI Sum(cm)     %Tile       Largest Pocket(cm)
 20.6            79

 RUQ(cm)       RLQ(cm)       LUQ(cm)        LLQ(cm)

Biometry

 BPD:        80  mm     G. Age:  32w 1d         53  %    CI:        73.24   %    70 - 86
                                                         FL/HC:      20.3   %    19.1 -
 HC:      297.1  mm     G. Age:  32w 6d         44  %    HC/AC:      1.04        0.96 -
 AC:      286.1  mm     G. Age:  32w 4d         75  %    FL/BPD:     75.5   %    71 - 87
 FL:       60.4  mm     G. Age:  31w 3d         28  %    FL/AC:      21.1   %    20 - 24
 HUM:      55.8  mm     G. Age:  32w 3d         65  %

 Est. FW:    4468  gm      4 lb 4 oz     57  %
OB History

 Gravidity:    1
Gestational Age

 LMP:           31w 5d        Date:  [DATE]                 EDD:   09/23/19
 U/S Today:     32w 2d                                        EDD:   06/29/20
 Best:          31w 5d     Det. By:  LMP  (06/25/20)          EDD:   09/23/19
Anatomy

 Cranium:               Appears normal         Aortic Arch:            Not well visualized
 Cavum:                 Appears normal         Ductal Arch:            Not well visualized
 Ventricles:            Appears normal         Diaphragm:              Appears normal
 Choroid Plexus:        Appears normal         Stomach:                Appears normal, left
                                                                       sided
 Cerebellum:            Appears normal         Abdomen:                Appears normal
 Posterior Fossa:       Appears normal         Abdominal Wall:         Appears nml (cord
                                                                       insert, abd wall)
 Nuchal Fold:           Not applicable (>20    Cord Vessels:           Appears normal (3
                        wks GA)                                        vessel cord)
 Face:                  Appears normal         Kidneys:                Appear normal
                        (orbits and profile)
 Lips:                  Appears normal         Bladder:                Appears normal
 Thoracic:              Appears normal         Spine:                  Ltd views no
                                                                       intracranial signs of
                                                                       NTD
 Heart:                 Appears normal         Upper Extremities:      Appears normal
                        (4CH, axis, and
                        situs)
 RVOT:                  Appears normal         Lower Extremities:      Appears normal
 LVOT:                  Appears normal

 Other:  Fetus appears to be a male. Technically difficult due to advanced
         gestational age.
Cervix Uterus Adnexa

 Cervix
 Not visualized (advanced GA >49wks)
Impression

 Single intrauterine pregnancy with measurements consistent
 with dates.
 Ms. Leblanc is dated by a 19 week ultrasound but she has a
 known LMP with regular periods.
 Her dates were changed to 06/29/20 however, given her LMP
 EDD of 07/05/20 the dates were changed, but they should
 have remained given that the dates were < 10 days different.
 Today we performed a detailed anatomy that was normal.
 There were no markers of aneuploidy. The EDD was
 consistent with LMP EDD.

 I discussed today's findings and recommended EDD of
 06/29/20. Ms. Leblanc had no further questions.
Recommendations

 Follow up growth in 4 weeks to trend the growth and confirm
 dates.

## 2021-11-19 ENCOUNTER — Encounter: Payer: Self-pay | Admitting: Certified Nurse Midwife

## 2021-11-27 ENCOUNTER — Encounter: Payer: Self-pay | Admitting: Radiology

## 2021-12-11 ENCOUNTER — Ambulatory Visit (INDEPENDENT_AMBULATORY_CARE_PROVIDER_SITE_OTHER): Payer: Medicaid Other | Admitting: *Deleted

## 2021-12-11 VITALS — BP 115/71 | HR 74 | Ht 67.0 in | Wt 247.8 lb

## 2021-12-11 DIAGNOSIS — Z3042 Encounter for surveillance of injectable contraceptive: Secondary | ICD-10-CM | POA: Diagnosis not present

## 2021-12-11 IMAGING — US US MFM OB FOLLOW-UP
1 series · 13 of 28 positions shown · non-contrast
Comparison: none

[Series 1: us mfm ob follow-up · 13 of 66 slices shown]
[im 3/66]
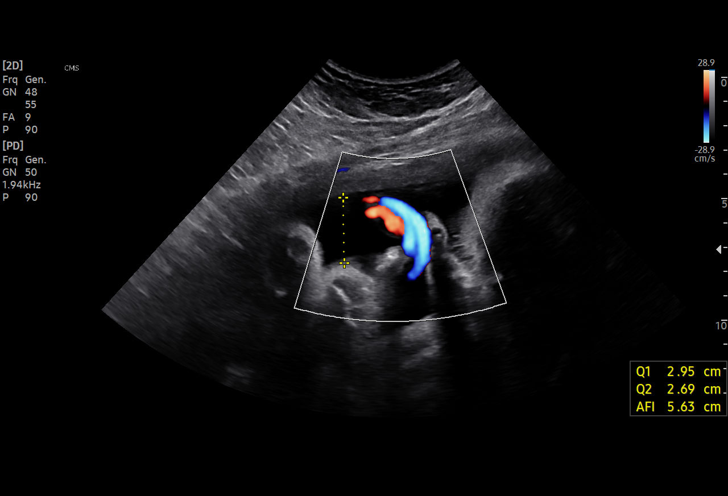
[im 8/66]
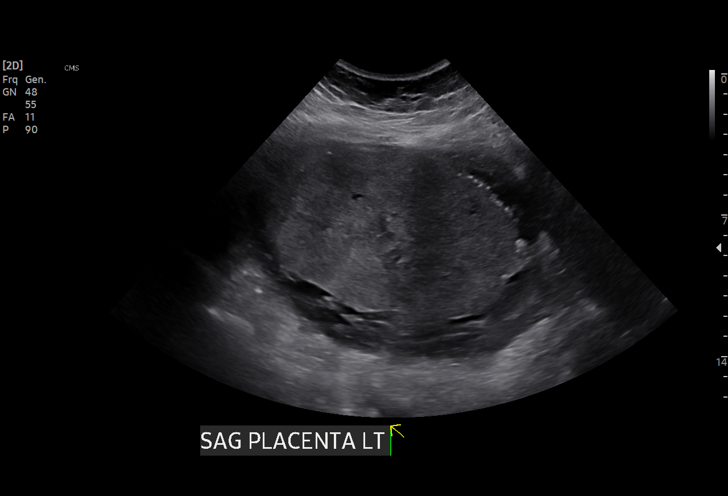
[im 13/66]
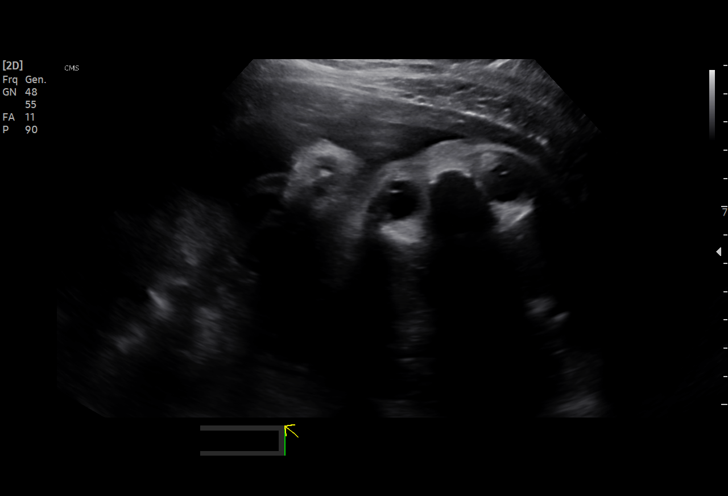
[im 17/66]
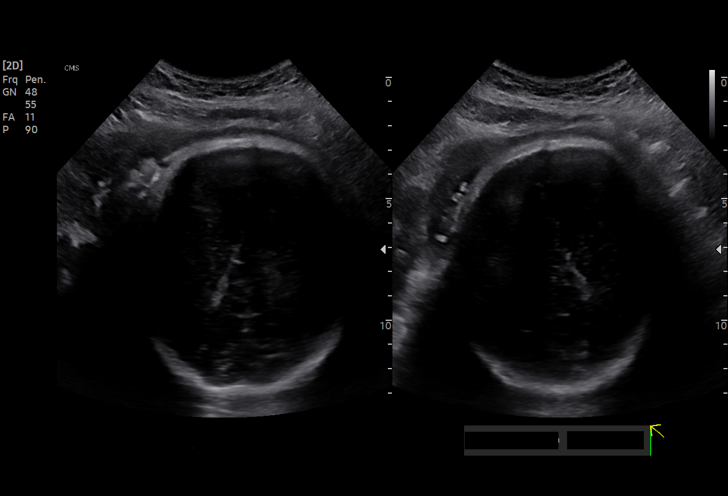
[im 22/66]
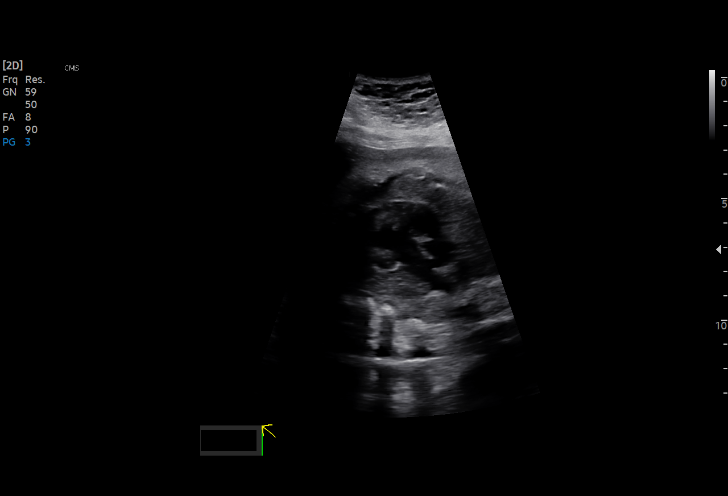
[im 27/66]
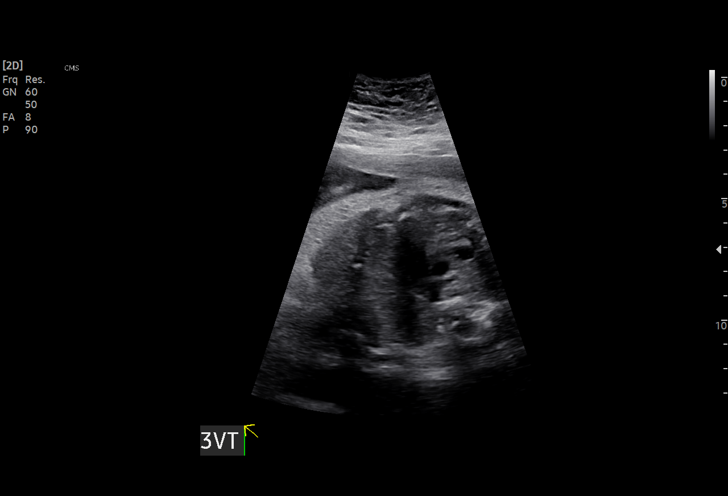
[im 34/66]
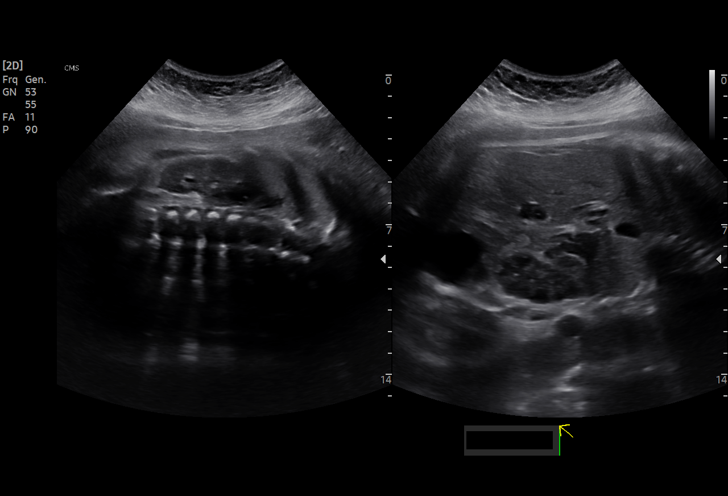
[im 39/66]
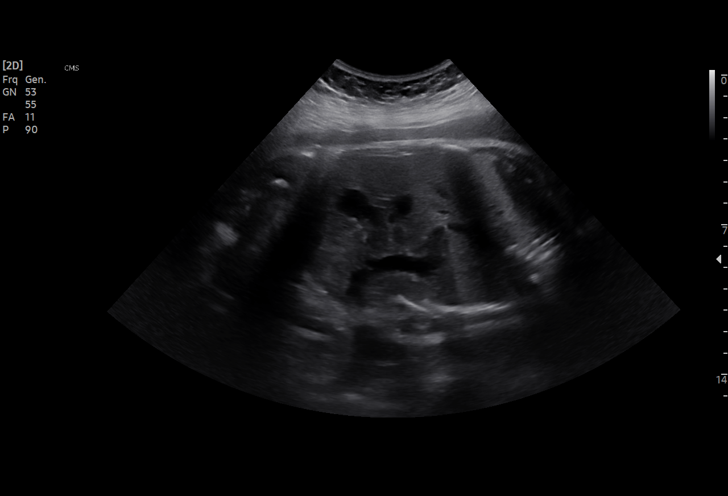
[im 44/66]
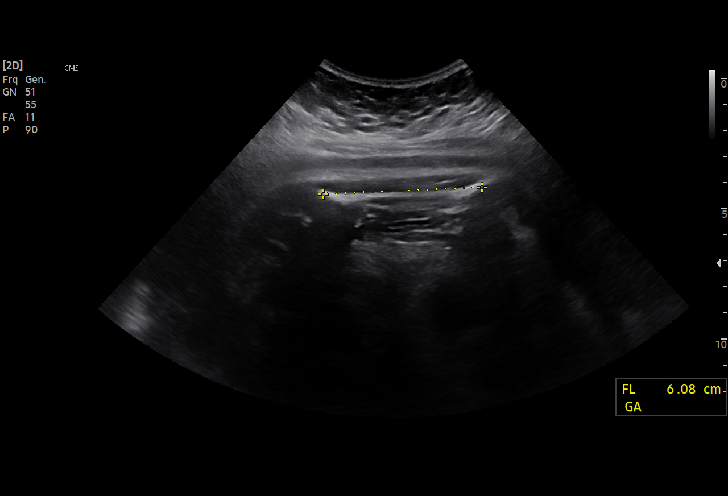
[im 49/66]
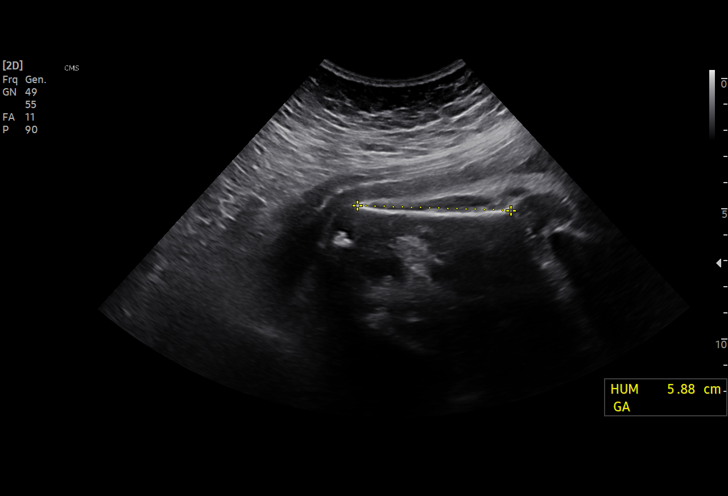
[im 53/66]
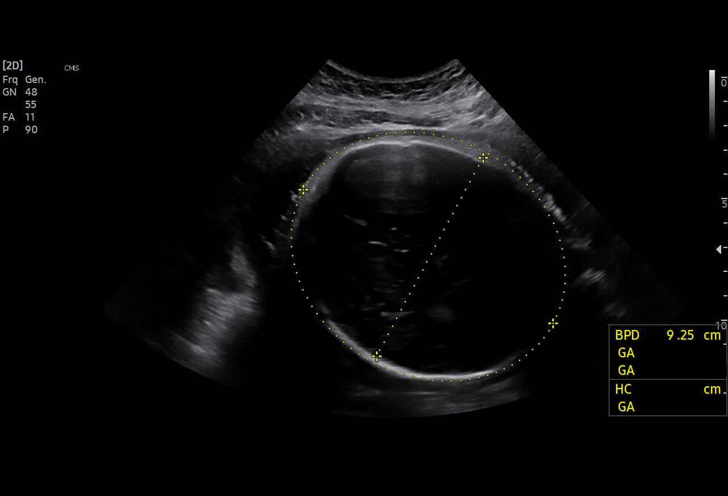
[im 58/66]
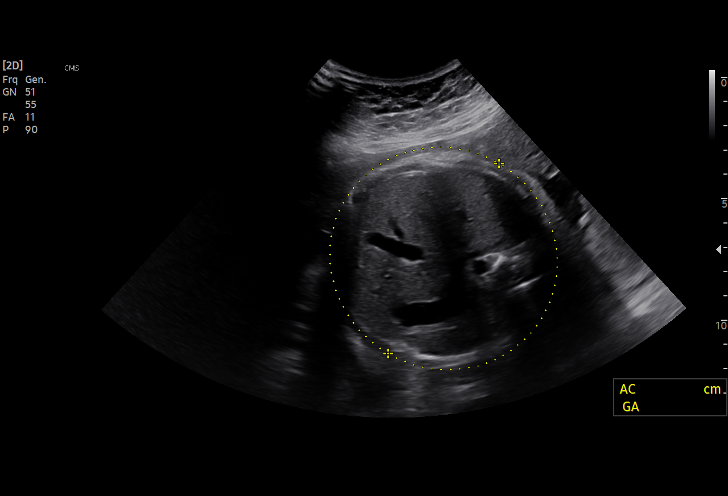
[im 63/66]
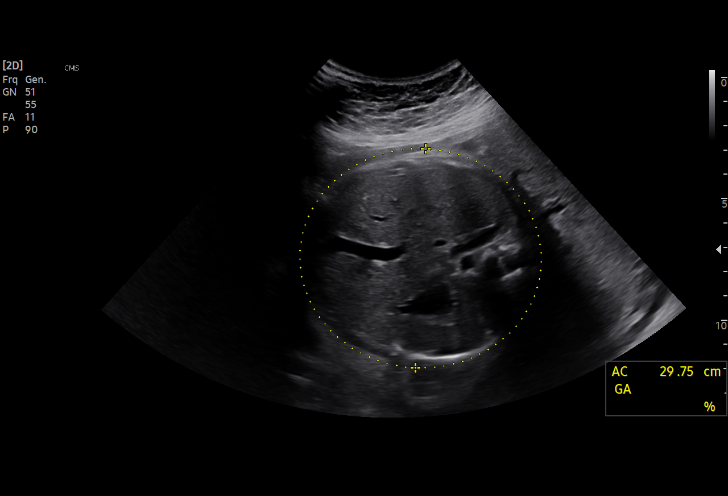

[13 of 28 positions shown; findings below may reference images not displayed]

BHAVIK

Indications

 Late to prenatal care, third trimester (low risk
 NIPS, neg Horizon 14)
 Antenatal follow-up for nonvisualized fetal
 anatomy
 35 weeks gestation of pregnancy
Fetal Evaluation

 Num Of Fetuses:         1
 Fetal Heart Rate(bpm):  157
 Cardiac Activity:       Observed
 Presentation:           Cephalic
 Placenta:               Posterior Left
 P. Cord Insertion:      Previously Visualized

 Amniotic Fluid
 AFI FV:      Within normal limits

 AFI Sum(cm)     %Tile       Largest Pocket(cm)
 12              36

 RUQ(cm)       RLQ(cm)       LUQ(cm)        LLQ(cm)
 3             3
Biometry
 BPD:        93  mm     G. Age:  37w 6d         96  %    CI:        76.61   %    70 - 86
                                                         FL/HC:      19.0   %    20.1 -
 HC:      336.6  mm     G. Age:  38w 4d         87  %    HC/AC:      1.13        0.93 -
 AC:      299.1  mm     G. Age:  33w 6d         14  %    FL/BPD:     68.8   %    71 - 87
 FL:         64  mm     G. Age:  33w 0d        2.7  %    FL/AC:      21.4   %    20 - 24
 HUM:      58.5  mm     G. Age:  33w 6d         34  %
 LV:        4.2  mm

 Est. FW:    5880  gm      5 lb 6 oz     21  %
OB History

 Gravidity:    1
Gestational Age

 LMP:           35w 4d        Date:  09/23/19                 EDD:   06/29/20
 U/S Today:     35w 6d                                        EDD:   06/27/20
 Best:          35w 4d     Det. By:  LMP  (09/23/19)          EDD:   06/29/20
Anatomy

 Cranium:               Appears normal         Aortic Arch:            Appears normal
 Cavum:                 Appears normal         Ductal Arch:            Appears normal
 Ventricles:            Appears normal         Diaphragm:              Appears normal
 Choroid Plexus:        Appears normal         Stomach:                Appears normal, left
                                                                       sided
 Cerebellum:            Appears normal         Abdomen:                Appears normal
 Posterior Fossa:       Appears normal         Abdominal Wall:         Previously seen
 Nuchal Fold:           Not applicable (>20    Cord Vessels:           Previously seen
                        wks GA)
 Face:                  Appears normal         Kidneys:                Appear normal
                        (orbits and profile)
 Lips:                  Appears normal         Bladder:                Appears normal
 Thoracic:              Appears normal         Spine:                  Limited views
                                                                       previously seen
 Heart:                 Appears normal         Upper Extremities:      Previously seen
                        (4CH, axis, and
                        situs)
 RVOT:                  Appears normal         Lower Extremities:      Previously seen
 LVOT:                  Appears normal

 Other:  Fetus appears to be a male. Technically difficult due to advanced
         gestational age.
Cervix Uterus Adnexa

 Cervix
 Not visualized (advanced GA >03wks)

 Uterus
 No abnormality visualized.

 Right Ovary
 Not visualized.
 Left Ovary
 Not visualized.

 Cul De Sac
 No free fluid seen.

 Adnexa
 No abnormality visualized.
Comments

 This patient was seen for a follow up growth scan as she
 presented late for prenatal care.  She denies any problems in
 her current pregnancy.
 She was informed that the fetal growth and amniotic fluid
 level appears appropriate for her gestational age.
 As the fetal growth is within normal limits, no further exams
 were scheduled in our office.

## 2021-12-11 MED ORDER — MEDROXYPROGESTERONE ACETATE 150 MG/ML IM SUSP
150.0000 mg | Freq: Once | INTRAMUSCULAR | Status: AC
Start: 2021-12-11 — End: 2021-12-11
  Administered 2021-12-11: 150 mg via INTRAMUSCULAR

## 2021-12-11 NOTE — Progress Notes (Signed)
Depo Provera 150 mg administered IM as scheduled.  Pt has no c/o.  Annual Gyn exam w/pap due March 2024. Next injection due 6/30-7/14. Pt voiced understanding.  ?

## 2022-02-26 ENCOUNTER — Other Ambulatory Visit: Payer: Self-pay

## 2022-02-26 ENCOUNTER — Ambulatory Visit (INDEPENDENT_AMBULATORY_CARE_PROVIDER_SITE_OTHER): Payer: Medicaid Other

## 2022-02-26 VITALS — BP 121/71 | HR 81 | Wt 257.6 lb

## 2022-02-26 DIAGNOSIS — Z3042 Encounter for surveillance of injectable contraceptive: Secondary | ICD-10-CM | POA: Diagnosis not present

## 2022-02-26 MED ORDER — MEDROXYPROGESTERONE ACETATE 150 MG/ML IM SUSP
150.0000 mg | Freq: Once | INTRAMUSCULAR | Status: AC
  Administered 2022-02-26: 150 mg via INTRAMUSCULAR

## 2022-02-26 NOTE — Progress Notes (Signed)
Teresa [name] here for Depo-Provera Injection. Patient states she would like to change birth control method due to undesired weight gain. Pt would like to receive injection today and schedule appt with provider for birth control counseling. Injection administered without complication. Patient will return in 3 months for next injection between 05/14/22 and 05/28/22 if she has not changed birth control method. Pt to schedule with provider during check out.  Marjo Bicker, RN 02/26/2022  9:21 AM

## 2022-05-14 ENCOUNTER — Other Ambulatory Visit: Payer: Self-pay

## 2022-05-14 ENCOUNTER — Ambulatory Visit (INDEPENDENT_AMBULATORY_CARE_PROVIDER_SITE_OTHER): Payer: Medicaid Other | Admitting: Family Medicine

## 2022-05-14 ENCOUNTER — Encounter: Payer: Self-pay | Admitting: Family Medicine

## 2022-05-14 VITALS — BP 124/79 | HR 91 | Ht 66.0 in | Wt 271.6 lb

## 2022-05-14 DIAGNOSIS — R635 Abnormal weight gain: Secondary | ICD-10-CM | POA: Diagnosis not present

## 2022-05-14 DIAGNOSIS — Z30017 Encounter for initial prescription of implantable subdermal contraceptive: Secondary | ICD-10-CM

## 2022-05-14 DIAGNOSIS — E66813 Obesity, class 3: Secondary | ICD-10-CM | POA: Insufficient documentation

## 2022-05-14 MED ORDER — ETONOGESTREL 68 MG ~~LOC~~ IMPL
68.0000 mg | DRUG_IMPLANT | Freq: Once | SUBCUTANEOUS | Status: AC
  Administered 2022-05-14: 68 mg via SUBCUTANEOUS

## 2022-05-14 NOTE — Progress Notes (Signed)
   Subjective:    Patient ID: Teresa [name] is a 21 y.o. female presenting with No chief complaint on file.  on 05/14/2022  HPI: Here to discuss contraception. On Depo since 1/23. Has gained about 40 lbs. Is interested in alternatives.  Review of Systems  Constitutional:  Negative for chills and fever.  Respiratory:  Negative for shortness of breath.   Cardiovascular:  Negative for chest pain.  Gastrointestinal:  Negative for abdominal pain, nausea and vomiting.  Genitourinary:  Negative for dysuria.  Skin:  Negative for rash.      Objective:    BP 124/79   Pulse 91   Ht 5\' 6"  (1.676 m)   Wt 271 lb 9.6 oz (123.2 kg)   BMI 43.84 kg/m  Physical Exam Exam conducted with a chaperone present.  Constitutional:      General: She is not in acute distress.    Appearance: She is well-developed.  HENT:     Head: Normocephalic and atraumatic.  Eyes:     General: No scleral icterus. Cardiovascular:     Rate and Rhythm: Normal rate.  Pulmonary:     Effort: Pulmonary effort is normal.  Abdominal:     Palpations: Abdomen is soft.  Musculoskeletal:     Cervical back: Neck supple.  Skin:    General: Skin is warm and dry.  Neurological:     Mental Status: She is alert and oriented to person, place, and time.    Procedure: Patient given informed consent, signed copy in the chart, time out was performed. In window for Depo. Appropriate time out taken.  Patient's left arm was prepped and draped in the usual sterile fashion.. The ruler used to measure and mark insertion area.  Pt was prepped with alcohol swab and then injected with 3 cc of 1% lidocaine with epinephrine.  Pt was prepped with betadine, Nexplanon removed form packaging,  Device confirmed in needle, then inserted full length of needle and withdrawn per handbook instructions.  Pt insertion site covered with steristrips and pressure dressing.   Minimal blood loss.  Pt tolerated the procedure well.       Assessment &  Plan:   Problem List Items Addressed This Visit       Unprioritized   Obesity, Class III, BMI 40-49.9 (morbid obesity) (HCC)    Discussed depo and impact on weight. Also discussed lifestyle measures and diet and exercise to improve weight.      Other Visit Diagnoses     Nexplanon insertion    -  Primary   s/p insertion today   Relevant Medications   etonogestrel (NEXPLANON) implant 68 mg (Completed)   Abnormal weight gain       will check TSH to ensure this is not the issue.   Relevant Orders   TSH        Return if symptoms worsen or fail to improve.  , MD 05/14/2022 8:47 AM

## 2022-05-14 NOTE — Assessment & Plan Note (Signed)
Discussed depo and impact on weight. Also discussed lifestyle measures and diet and exercise to improve weight.

## 2022-05-15 LAB — TSH: TSH: 1.67 u[IU]/mL (ref 0.450–4.500)

## 2022-07-30 ENCOUNTER — Emergency Department (HOSPITAL_COMMUNITY): Payer: Medicaid Other

## 2022-07-30 ENCOUNTER — Emergency Department (HOSPITAL_COMMUNITY)
Admission: EM | Admit: 2022-07-30 | Discharge: 2022-07-31 | Disposition: A | Discharge status: Home / Self Care | Payer: Medicaid Other | Attending: Emergency Medicine | Admitting: Emergency Medicine

## 2022-07-30 ENCOUNTER — Other Ambulatory Visit: Payer: Self-pay

## 2022-07-30 DIAGNOSIS — S46912A Strain of unspecified muscle, fascia and tendon at shoulder and upper arm level, left arm, initial encounter: Secondary | ICD-10-CM | POA: Diagnosis not present

## 2022-07-30 DIAGNOSIS — S4992XA Unspecified injury of left shoulder and upper arm, initial encounter: Secondary | ICD-10-CM | POA: Diagnosis present

## 2022-07-30 DIAGNOSIS — Z041 Encounter for examination and observation following transport accident: Secondary | ICD-10-CM | POA: Diagnosis not present

## 2022-07-30 DIAGNOSIS — Y92410 Unspecified street and highway as the place of occurrence of the external cause: Secondary | ICD-10-CM | POA: Insufficient documentation
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
## 2022-08-03 ENCOUNTER — Telehealth: Payer: Self-pay | Admitting: *Deleted

## 2022-08-03 NOTE — Patient Outreach (Signed)
  Care Coordination [facility] Note Transition Care Management Unsuccessful Follow-up Telephone Call  Date of discharge and from where:  07/30/22 from [facility] ED  Attempts:  1st Attempt  Reason for unsuccessful TCM follow-up call:  Left voice message   Estanislado Emms RN, BSN [place]  [facility] RN Care Coordinator

## 2022-11-12 DIAGNOSIS — H5213 Myopia, bilateral: Secondary | ICD-10-CM | POA: Diagnosis not present

## 2023-05-20 ENCOUNTER — Ambulatory Visit: Payer: Medicaid Other | Admitting: Family Medicine

## 2023-05-20 VITALS — BP 110/74 | HR 80 | Temp 98.1°F | Resp 16 | Ht 67.0 in | Wt 298.6 lb

## 2023-05-20 DIAGNOSIS — Z7689 Persons encountering health services in other specified circumstances: Secondary | ICD-10-CM

## 2023-05-20 DIAGNOSIS — H029 Unspecified disorder of eyelid: Secondary | ICD-10-CM

## 2023-05-20 MED ORDER — PHENTERMINE HCL 37.5 MG PO TABS: 37.5000 mg | ORAL_TABLET | Freq: Every day | ORAL | 0 refills | Status: DC

## 2023-05-20 MED ORDER — ERYTHROMYCIN 5 MG/GM OP OINT: 1.0000 | TOPICAL_OINTMENT | Freq: Every day | OPHTHALMIC | 0 refills | Status: DC

## 2023-05-20 NOTE — Progress Notes (Unsigned)
New Patient Office Visit  Subjective    Patient ID: VERNESSA GAILES, female    DOB: 2001-07-09  Age: 22 y.o. MRN: [id number]  CC:  Chief Complaint  Patient presents with   Establish Care    HPI SAMRIDDHI SMELLEY presents to establish care ***  Outpatient Encounter Medications as of 05/20/2023  Medication Sig   GENABIO COVID-19 RAPID TEST KIT TEST AS DIRECTED TODAY   ascorbic acid (VITAMIN C) 500 MG tablet Take 1 tablet (500 mg total) by mouth every other day.   ferrous sulfate (FERROUSUL) 325 (65 FE) MG tablet Take 1 tablet (325 mg total) by mouth every other day. Take with Vitamin C tablet   Prenatal Vit w/Fe-Methylfol-FA (PNV PO) Take by mouth.   No facility-administered encounter medications on file as of 05/20/2023.    Past Medical History:  Diagnosis Date   Anemia    Family history of adverse reaction to anesthesia    lidocaine does not work on her mother    Past Surgical History:  Procedure Laterality Date   NO PAST SURGERIES      Family History  Problem Relation Age of Onset   Diabetes Father     Social History   Socioeconomic History   Marital status: Single    Spouse name: Not on file   Number of children: 1   Years of education: Not on file   Highest education level: Associate degree: occupational, Scientist, product/process development, or vocational program  Occupational History    Comment: Programme researcher, broadcasting/film/video  Tobacco Use   Smoking status: Never    Passive exposure: Never   Smokeless tobacco: Never  Vaping Use   Vaping status: Never Used  Substance and Sexual Activity   Alcohol use: Not Currently   Drug use: Never   Sexual activity: Yes  Other Topics Concern   Not on file  Social History Narrative   Late to care   Social Determinants of Health   Financial Resource Strain: Low Risk  (05/20/2023)   Overall Financial Resource Strain (CARDIA)    Difficulty of Paying Living Expenses: Not hard at all  Food Insecurity: No Food Insecurity (05/20/2023)   Hunger Vital Sign     Worried About Running Out of Food in the Last Year: Never true    Ran Out of Food in the Last Year: Never true  Transportation Needs: No Transportation Needs (05/20/2023)   PRAPARE - Administrator, Civil Service (Medical): No    Lack of Transportation (Non-Medical): No  Physical Activity: Sufficiently Active (05/20/2023)   Exercise Vital Sign    Days of Exercise per Week: 7 days    Minutes of Exercise per Session: 50 min  Stress: No Stress Concern Present (05/20/2023)   [employer] of Occupational Health - Occupational Stress Questionnaire    Feeling of Stress : Not at all  Social Connections: Moderately Integrated (05/20/2023)   Social Connection and Isolation Panel [NHANES]    Frequency of Communication with Friends and Family: More than three times a week    Frequency of Social Gatherings with Friends and Family: More than three times a week    Attends Religious Services: More than 4 times per year    Active Member of Clubs or Organizations: Patient unable to answer    Attends Banker Meetings: Never    Marital Status: Living with partner  Intimate Partner Violence: Not At Risk (05/20/2023)   Humiliation, Afraid, Rape, and Kick questionnaire    Fear  of Current or Ex-Partner: No    Emotionally Abused: No    Physically Abused: No    Sexually Abused: No    ROS      Objective    BP 110/74   Pulse 80   Temp 98.1 F (36.7 C) (Oral)   Resp 16   Ht 5\' 7"  (1.702 m)   Wt 298 lb 9.6 oz (135.4 kg)   SpO2 97%   Breastfeeding Unknown   BMI 46.77 kg/m   Physical Exam  {Labs (Optional):23779}    Assessment & Plan:   Encounter to establish care     No follow-ups on file.   Tommie Raymond, MD

## 2023-05-24 ENCOUNTER — Encounter: Payer: Self-pay | Admitting: Family Medicine

## 2023-06-24 ENCOUNTER — Ambulatory Visit (INDEPENDENT_AMBULATORY_CARE_PROVIDER_SITE_OTHER): Payer: Medicaid Other | Admitting: Family Medicine

## 2023-06-24 ENCOUNTER — Encounter: Payer: Self-pay | Admitting: Family Medicine

## 2023-06-24 VITALS — BP 130/84 | HR 100 | Temp 98.1°F | Resp 18 | Ht 67.5 in | Wt 284.0 lb

## 2023-06-24 DIAGNOSIS — E66813 Obesity, class 3: Secondary | ICD-10-CM

## 2023-06-24 DIAGNOSIS — H029 Unspecified disorder of eyelid: Secondary | ICD-10-CM | POA: Diagnosis not present

## 2023-06-24 DIAGNOSIS — Z7689 Persons encountering health services in other specified circumstances: Secondary | ICD-10-CM

## 2023-06-24 DIAGNOSIS — Z6841 Body Mass Index (BMI) 40.0 and over, adult: Secondary | ICD-10-CM | POA: Diagnosis not present

## 2023-06-24 MED ORDER — PHENTERMINE HCL 37.5 MG PO TABS: 37.5000 mg | ORAL_TABLET | Freq: Every day | ORAL | 0 refills | Status: DC

## 2023-06-24 NOTE — Progress Notes (Unsigned)
Established Patient Office Visit  Subjective    Patient ID: Teresa [name], female    DOB: [birth year]  Age: 22 y.o. MRN: [id number]  CC:  Chief Complaint  Patient presents with   Follow-up    Bump on eye    HPI Teresa [name] presents for follow up of weight management and also for stye on her eye. Patient reports weight loss as well as improvement of stye.   Outpatient Encounter Medications as of 06/24/2023  Medication Sig   erythromycin ophthalmic ointment Place 1 Application into the right eye at bedtime.   phentermine (ADIPEX-P) 37.5 MG tablet Take 1 tablet (37.5 mg total) by mouth daily before breakfast.   No facility-administered encounter medications on file as of 06/24/2023.    Past Medical History:  Diagnosis Date   Anemia    Family history of adverse reaction to anesthesia    lidocaine does not work on her mother    Past Surgical History:  Procedure Laterality Date   NO PAST SURGERIES      Family History  Problem Relation Age of Onset   Diabetes Father     Social History   Socioeconomic History   Marital status: Single    Spouse name: Not on file   Number of children: 1   Years of education: Not on file   Highest education level: Associate degree: occupational, Scientist, product/process development, or vocational program  Occupational History    Comment: Programme researcher, broadcasting/film/video  Tobacco Use   Smoking status: Never    Passive exposure: Never   Smokeless tobacco: Never  Vaping Use   Vaping status: Never Used  Substance and Sexual Activity   Alcohol use: Not Currently   Drug use: Never   Sexual activity: Yes  Other Topics Concern   Not on file  Social History Narrative   Late to care   Social Determinants of Health   Financial Resource Strain: Low Risk  (05/20/2023)   Overall Financial Resource Strain (CARDIA)    Difficulty of Paying Living Expenses: Not hard at all  Food Insecurity: No Food Insecurity (05/20/2023)   Hunger Vital Sign    Worried About Running Out of  Food in the Last Year: Never true    Ran Out of Food in the Last Year: Never true  Transportation Needs: No Transportation Needs (05/20/2023)   PRAPARE - Administrator, Civil Service (Medical): No    Lack of Transportation (Non-Medical): No  Physical Activity: Sufficiently Active (05/20/2023)   Exercise Vital Sign    Days of Exercise per Week: 7 days    Minutes of Exercise per Session: 50 min  Stress: No Stress Concern Present (05/20/2023)   [employer] of Occupational Health - Occupational Stress Questionnaire    Feeling of Stress : Not at all  Social Connections: Moderately Integrated (05/20/2023)   Social Connection and Isolation Panel [NHANES]    Frequency of Communication with Friends and Family: More than three times a week    Frequency of Social Gatherings with Friends and Family: More than three times a week    Attends Religious Services: More than 4 times per year    Active Member of Clubs or Organizations: Patient unable to answer    Attends Banker Meetings: Never    Marital Status: Living with partner  Intimate Partner Violence: Not At Risk (05/20/2023)   Humiliation, Afraid, Rape, and Kick questionnaire    Fear of Current or Ex-Partner: No    Emotionally Abused:  No    Physically Abused: No    Sexually Abused: No    Review of Systems  All other systems reviewed and are negative.       Objective    BP 130/84 (BP Location: Right Arm, Patient Position: Sitting, Cuff Size: Large)   Pulse 100   Temp 98.1 F (36.7 C) (Oral)   Resp 18   Ht 5' 7.5" (1.715 m)   Wt 284 lb (128.8 kg)   SpO2 98%   BMI 43.82 kg/m   Physical Exam Vitals and nursing note reviewed.  Constitutional:      General: She is not in acute distress.    Appearance: She is obese.  Eyes:     General:        Right eye: Hordeolum present.  Cardiovascular:     Rate and Rhythm: Normal rate and regular rhythm.  Pulmonary:     Effort: Pulmonary effort is normal.      Breath sounds: Normal breath sounds.  Abdominal:     Palpations: Abdomen is soft.     Tenderness: There is no abdominal tenderness.  Neurological:     General: No focal deficit present.     Mental Status: She is alert and oriented to person, place, and time.     {Labs (Optional):23779}    Assessment & Plan:  1. Encounter for weight management Doing well. Phentermine refilled.   2. Obesity, Class III, BMI 40-49.9 (morbid obesity) (HCC)   3. Eyelid lesion Much improved. Continue medication as needed.     No follow-ups on file.   Tommie Raymond, MD

## 2023-06-27 ENCOUNTER — Encounter: Payer: Self-pay | Admitting: Family Medicine

## 2023-08-05 ENCOUNTER — Encounter: Payer: Self-pay | Admitting: Family Medicine

## 2023-08-05 ENCOUNTER — Ambulatory Visit: Payer: Medicaid Other | Admitting: Family Medicine

## 2023-08-05 VITALS — BP 130/84 | HR 94 | Temp 98.5°F | Resp 16 | Ht 67.0 in | Wt 279.2 lb

## 2023-08-05 DIAGNOSIS — Z7689 Persons encountering health services in other specified circumstances: Secondary | ICD-10-CM | POA: Diagnosis not present

## 2023-08-05 MED ORDER — PHENTERMINE HCL 37.5 MG PO TABS: 37.5000 mg | ORAL_TABLET | Freq: Every day | ORAL | 0 refills | Status: DC

## 2023-08-09 ENCOUNTER — Encounter: Payer: Self-pay | Admitting: Family Medicine

## 2023-08-09 NOTE — Progress Notes (Signed)
Established Patient Office Visit  Subjective    Patient ID: Teresa [name], female    DOB: [birth year]  Age: 22 y.o. MRN: [id number]  CC:  Chief Complaint  Patient presents with   Follow-up    4 week    HPI Teresa [name] presents for routine weight management. She reports doing well and denies acute complaints.   Outpatient Encounter Medications as of 08/05/2023  Medication Sig   erythromycin ophthalmic ointment Place 1 Application into the right eye at bedtime.   [DISCONTINUED] phentermine (ADIPEX-P) 37.5 MG tablet Take 1 tablet (37.5 mg total) by mouth daily before breakfast.   phentermine (ADIPEX-P) 37.5 MG tablet Take 1 tablet (37.5 mg total) by mouth daily before breakfast.   No facility-administered encounter medications on file as of 08/05/2023.    Past Medical History:  Diagnosis Date   Anemia    Family history of adverse reaction to anesthesia    lidocaine does not work on her mother    Past Surgical History:  Procedure Laterality Date   NO PAST SURGERIES      Family History  Problem Relation Age of Onset   Diabetes Father     Social History   Socioeconomic History   Marital status: Single    Spouse name: Not on file   Number of children: 1   Years of education: Not on file   Highest education level: Associate degree: occupational, Scientist, product/process development, or vocational program  Occupational History    Comment: Programme researcher, broadcasting/film/video  Tobacco Use   Smoking status: Never    Passive exposure: Never   Smokeless tobacco: Never  Vaping Use   Vaping status: Never Used  Substance and Sexual Activity   Alcohol use: Not Currently   Drug use: Never   Sexual activity: Yes  Other Topics Concern   Not on file  Social History Narrative   Late to care   Social Determinants of Health   Financial Resource Strain: Low Risk  (05/20/2023)   Overall Financial Resource Strain (CARDIA)    Difficulty of Paying Living Expenses: Not hard at all  Food Insecurity: No Food  Insecurity (05/20/2023)   Hunger Vital Sign    Worried About Running Out of Food in the Last Year: Never true    Ran Out of Food in the Last Year: Never true  Transportation Needs: No Transportation Needs (05/20/2023)   PRAPARE - Administrator, Civil Service (Medical): No    Lack of Transportation (Non-Medical): No  Physical Activity: Sufficiently Active (05/20/2023)   Exercise Vital Sign    Days of Exercise per Week: 7 days    Minutes of Exercise per Session: 50 min  Stress: No Stress Concern Present (05/20/2023)   [employer] of Occupational Health - Occupational Stress Questionnaire    Feeling of Stress : Not at all  Social Connections: Moderately Integrated (05/20/2023)   Social Connection and Isolation Panel [NHANES]    Frequency of Communication with Friends and Family: More than three times a week    Frequency of Social Gatherings with Friends and Family: More than three times a week    Attends Religious Services: More than 4 times per year    Active Member of Clubs or Organizations: Patient unable to answer    Attends Banker Meetings: Never    Marital Status: Living with partner  Intimate Partner Violence: Not At Risk (05/20/2023)   Humiliation, Afraid, Rape, and Kick questionnaire    Fear of Current or  Ex-Partner: No    Emotionally Abused: No    Physically Abused: No    Sexually Abused: No    Review of Systems  All other systems reviewed and are negative.       Objective    BP 130/84 (BP Location: Right Arm, Patient Position: Sitting, Cuff Size: Large)   Pulse 94   Temp 98.5 F (36.9 C) (Oral)   Resp 16   Ht 5\' 7"  (1.702 m)   Wt 279 lb 3.2 oz (126.6 kg)   SpO2 97%   BMI 43.73 kg/m   Physical Exam Vitals and nursing note reviewed.  Constitutional:      General: She is not in acute distress.    Appearance: She is obese.  Eyes:     General:        Right eye: Hordeolum present.  Cardiovascular:     Rate and Rhythm: Normal rate  and regular rhythm.  Pulmonary:     Effort: Pulmonary effort is normal.     Breath sounds: Normal breath sounds.  Abdominal:     Palpations: Abdomen is soft.     Tenderness: There is no abdominal tenderness.  Neurological:     General: No focal deficit present.     Mental Status: She is alert and oriented to person, place, and time.         Assessment & Plan:   Encounter for weight management  Obesity, Class III, BMI 40-49.9 (morbid obesity) (HCC)  Other orders -     Phentermine HCl; Take 1 tablet (37.5 mg total) by mouth daily before breakfast.  Dispense: 30 tablet; Refill: 0     Return in about 4 weeks (around 09/02/2023) for follow up.   Teresa Raymond, MD

## 2023-09-09 ENCOUNTER — Ambulatory Visit: Payer: Medicaid Other | Admitting: Family Medicine

## 2023-09-09 VITALS — BP 129/83 | HR 84 | Temp 98.4°F | Resp 18 | Ht 67.5 in | Wt 270.6 lb

## 2023-09-09 DIAGNOSIS — E66813 Obesity, class 3: Secondary | ICD-10-CM

## 2023-09-09 DIAGNOSIS — Z7689 Persons encountering health services in other specified circumstances: Secondary | ICD-10-CM | POA: Diagnosis not present

## 2023-09-09 DIAGNOSIS — Z6841 Body Mass Index (BMI) 40.0 and over, adult: Secondary | ICD-10-CM | POA: Diagnosis not present

## 2023-09-09 MED ORDER — PHENTERMINE HCL 37.5 MG PO TABS: 37.5000 mg | ORAL_TABLET | Freq: Every day | ORAL | 0 refills | Status: DC

## 2023-09-09 NOTE — Progress Notes (Signed)
 Established Patient Office Visit  Subjective    Patient ID: Teresa [name], female    DOB: [birth year]  Age: 23 y.o. MRN: [id number]  CC:  Chief Complaint  Patient presents with   Follow-up    1 month    HPI Teresa [name] presents for routine weight management. Patient denies acute complaints or concerns.   Outpatient Encounter Medications as of 09/09/2023  Medication Sig   [DISCONTINUED] phentermine  (ADIPEX-P ) 37.5 MG tablet Take 1 tablet (37.5 mg total) by mouth daily before breakfast.   erythromycin  ophthalmic ointment Place 1 Application into the right eye at bedtime. (Patient not taking: Reported on 09/09/2023)   phentermine  (ADIPEX-P ) 37.5 MG tablet Take 1 tablet (37.5 mg total) by mouth daily before breakfast.   No facility-administered encounter medications on file as of 09/09/2023.    Past Medical History:  Diagnosis Date   Anemia    Family history of adverse reaction to anesthesia    lidocaine  does not work on her mother    Past Surgical History:  Procedure Laterality Date   NO PAST SURGERIES      Family History  Problem Relation Age of Onset   Diabetes Father     Social History   Socioeconomic History   Marital status: Single    Spouse name: Not on file   Number of children: 1   Years of education: Not on file   Highest education level: Associate degree: occupational, scientist, product/process development, or vocational program  Occupational History    Comment: Programme Researcher, Broadcasting/film/video  Tobacco Use   Smoking status: Never    Passive exposure: Never   Smokeless tobacco: Never  Vaping Use   Vaping status: Never Used  Substance and Sexual Activity   Alcohol use: Not Currently   Drug use: Never   Sexual activity: Yes  Other Topics Concern   Not on file  Social History Narrative   Late to care   Social Drivers of Health   Financial Resource Strain: Low Risk  (09/09/2023)   Overall Financial Resource Strain (CARDIA)    Difficulty of Paying Living Expenses: Not very hard   Food Insecurity: No Food Insecurity (09/09/2023)   Hunger Vital Sign    Worried About Running Out of Food in the Last Year: Never true    Ran Out of Food in the Last Year: Never true  Transportation Needs: No Transportation Needs (09/09/2023)   PRAPARE - Administrator, Civil Service (Medical): No    Lack of Transportation (Non-Medical): No  Physical Activity: Sufficiently Active (09/09/2023)   Exercise Vital Sign    Days of Exercise per Week: 5 days    Minutes of Exercise per Session: 30 min  Stress: No Stress Concern Present (09/09/2023)   [employer] of Occupational Health - Occupational Stress Questionnaire    Feeling of Stress : Not at all  Social Connections: Moderately Integrated (09/09/2023)   Social Connection and Isolation Panel [NHANES]    Frequency of Communication with Friends and Family: More than three times a week    Frequency of Social Gatherings with Friends and Family: Once a week    Attends Religious Services: More than 4 times per year    Active Member of [employer] or Organizations: No    Attends Banker Meetings: Never    Marital Status: Married  Catering Manager Violence: Not At Risk (05/20/2023)   Humiliation, Afraid, Rape, and Kick questionnaire    Fear of Current or Ex-Partner: No  Emotionally Abused: No    Physically Abused: No    Sexually Abused: No    Review of Systems  All other systems reviewed and are negative.       Objective    BP 129/83 (BP Location: Right Arm, Patient Position: Sitting, Cuff Size: Large)   Pulse 84   Temp 98.4 F (36.9 C) (Oral)   Resp 18   Ht 5' 7.5 (1.715 m)   Wt 270 lb 9.6 oz (122.7 kg)   SpO2 98%   BMI 41.76 kg/m   Physical Exam Vitals and nursing note reviewed.  Constitutional:      General: She is not in acute distress.    Appearance: She is obese.  Cardiovascular:     Rate and Rhythm: Normal rate and regular rhythm.  Pulmonary:     Effort: Pulmonary effort is normal.      Breath sounds: Normal breath sounds.  Abdominal:     Palpations: Abdomen is soft.     Tenderness: There is no abdominal tenderness.  Neurological:     General: No focal deficit present.     Mental Status: She is alert and oriented to person, place, and time.         Assessment & Plan:   1. Encounter for weight management (Primary) Continues to do well with present management. Phentermine  prescribed.   2. Obesity, Class III, BMI 40-49.9 (morbid obesity) (HCC)   Return in 4 weeks (on 10/07/2023) for follow up, weight management.   Tanda Raguel SQUIBB, MD

## 2023-09-13 ENCOUNTER — Encounter: Payer: Self-pay | Admitting: Family Medicine

## 2023-10-07 ENCOUNTER — Ambulatory Visit: Payer: Medicaid Other | Admitting: Family Medicine

## 2023-10-07 ENCOUNTER — Encounter: Payer: Self-pay | Admitting: Family Medicine

## 2023-10-07 VITALS — BP 125/85 | HR 101 | Temp 98.2°F | Resp 16 | Ht 66.5 in | Wt 267.6 lb

## 2023-10-07 DIAGNOSIS — Z7689 Persons encountering health services in other specified circumstances: Secondary | ICD-10-CM | POA: Diagnosis not present

## 2023-10-07 DIAGNOSIS — Z6841 Body Mass Index (BMI) 40.0 and over, adult: Secondary | ICD-10-CM

## 2023-10-07 DIAGNOSIS — E66813 Obesity, class 3: Secondary | ICD-10-CM | POA: Diagnosis not present

## 2023-10-07 MED ORDER — PHENTERMINE HCL 37.5 MG PO TABS: 37.5000 mg | ORAL_TABLET | Freq: Every day | ORAL | 0 refills | Status: DC

## 2023-10-11 ENCOUNTER — Encounter: Payer: Self-pay | Admitting: Family Medicine

## 2023-10-11 NOTE — Progress Notes (Signed)
 Established Patient Office Visit  Subjective    Patient ID: Teresa [name], female    DOB: Sep 07, 2000  Age: 23 y.o. MRN: [id number]  CC:  Chief Complaint  Patient presents with   Weight Check    HPI Samari A Cassis presents for routine weight management. Patient denies acute complaints.   Outpatient Encounter Medications as of 10/07/2023  Medication Sig   [DISCONTINUED] phentermine  (ADIPEX-P ) 37.5 MG tablet Take 1 tablet (37.5 mg total) by mouth daily before breakfast.   erythromycin  ophthalmic ointment Place 1 Application into the right eye at bedtime. (Patient not taking: Reported on 09/09/2023)   phentermine  (ADIPEX-P ) 37.5 MG tablet Take 1 tablet (37.5 mg total) by mouth daily before breakfast.   No facility-administered encounter medications on file as of 10/07/2023.    Past Medical History:  Diagnosis Date   Anemia    Family history of adverse reaction to anesthesia    lidocaine  does not work on her mother    Past Surgical History:  Procedure Laterality Date   NO PAST SURGERIES      Family History  Problem Relation Age of Onset   Diabetes Father     Social History   Socioeconomic History   Marital status: Single    Spouse name: Not on file   Number of children: 1   Years of education: Not on file   Highest education level: Associate degree: occupational, scientist, product/process development, or vocational program  Occupational History    Comment: Programme Researcher, Broadcasting/film/video  Tobacco Use   Smoking status: Never    Passive exposure: Never   Smokeless tobacco: Never  Vaping Use   Vaping status: Never Used  Substance and Sexual Activity   Alcohol use: Not Currently   Drug use: Never   Sexual activity: Yes  Other Topics Concern   Not on file  Social History Narrative   Late to care   Social Drivers of Health   Financial Resource Strain: Low Risk  (09/09/2023)   Overall Financial Resource Strain (CARDIA)    Difficulty of Paying Living Expenses: Not very hard  Food Insecurity: No Food  Insecurity (09/09/2023)   Hunger Vital Sign    Worried About Running Out of Food in the Last Year: Never true    Ran Out of Food in the Last Year: Never true  Transportation Needs: No Transportation Needs (09/09/2023)   PRAPARE - Administrator, Civil Service (Medical): No    Lack of Transportation (Non-Medical): No  Physical Activity: Sufficiently Active (09/09/2023)   Exercise Vital Sign    Days of Exercise per Week: 5 days    Minutes of Exercise per Session: 30 min  Stress: No Stress Concern Present (09/09/2023)   [employer] of Occupational Health - Occupational Stress Questionnaire    Feeling of Stress : Not at all  Social Connections: Moderately Integrated (09/09/2023)   Social Connection and Isolation Panel [NHANES]    Frequency of Communication with Friends and Family: More than three times a week    Frequency of Social Gatherings with Friends and Family: Once a week    Attends Religious Services: More than 4 times per year    Active Member of [employer] or Organizations: No    Attends Banker Meetings: Never    Marital Status: Married  Catering Manager Violence: Not At Risk (05/20/2023)   Humiliation, Afraid, Rape, and Kick questionnaire    Fear of Current or Ex-Partner: No    Emotionally Abused: No  Physically Abused: No    Sexually Abused: No    Review of Systems  All other systems reviewed and are negative.       Objective    BP 125/85 (BP Location: Right Arm, Patient Position: Sitting, Cuff Size: Large)   Pulse (!) 101   Temp 98.2 F (36.8 C) (Oral)   Resp 16   Ht 5' 6.5 (1.689 m)   Wt 267 lb 9.6 oz (121.4 kg)   SpO2 98%   BMI 42.54 kg/m   Physical Exam Vitals and nursing note reviewed.  Constitutional:      General: She is not in acute distress.    Appearance: She is obese.  Cardiovascular:     Rate and Rhythm: Normal rate and regular rhythm.  Pulmonary:     Effort: Pulmonary effort is normal.     Breath sounds: Normal  breath sounds.  Abdominal:     Palpations: Abdomen is soft.     Tenderness: There is no abdominal tenderness.  Neurological:     General: No focal deficit present.     Mental Status: She is alert and oriented to person, place, and time.         Assessment & Plan:   Encounter for weight management  Obesity, Class III, BMI 40-49.9 (morbid obesity) (HCC)  Other orders -     Phentermine  HCl; Take 1 tablet (37.5 mg total) by mouth daily before breakfast.  Dispense: 30 tablet; Refill: 0     Return in about 4 weeks (around 11/04/2023) for follow up, weight management.   Tanda Raguel SQUIBB, MD

## 2023-10-22 ENCOUNTER — Emergency Department (HOSPITAL_COMMUNITY)
Admission: EM | Admit: 2023-10-22 | Discharge: 2023-10-22 | Disposition: A | Discharge status: Home / Self Care | Payer: Medicaid Other | Attending: Emergency Medicine | Admitting: Emergency Medicine

## 2023-10-22 ENCOUNTER — Emergency Department (HOSPITAL_COMMUNITY): Payer: Medicaid Other

## 2023-10-22 ENCOUNTER — Encounter (HOSPITAL_COMMUNITY): Payer: Self-pay | Admitting: Emergency Medicine

## 2023-10-22 ENCOUNTER — Other Ambulatory Visit: Payer: Self-pay

## 2023-10-22 DIAGNOSIS — M549 Dorsalgia, unspecified: Secondary | ICD-10-CM | POA: Diagnosis not present

## 2023-10-22 DIAGNOSIS — M5441 Lumbago with sciatica, right side: Secondary | ICD-10-CM | POA: Diagnosis not present

## 2023-10-22 DIAGNOSIS — M545 Low back pain, unspecified: Secondary | ICD-10-CM | POA: Diagnosis present

## 2023-10-22 DIAGNOSIS — M51369 Other intervertebral disc degeneration, lumbar region without mention of lumbar back pain or lower extremity pain: Secondary | ICD-10-CM | POA: Diagnosis not present

## 2023-10-22 LAB — POC URINE PREG, ED: Preg Test, Ur: NEGATIVE

## 2023-10-22 MED ORDER — PREDNISONE 10 MG (21) PO TBPK: ORAL_TABLET | Freq: Every day | ORAL | 0 refills | Status: AC

## 2023-10-22 MED ORDER — OXYCODONE-ACETAMINOPHEN 5-325 MG PO TABS: 1.0000 | ORAL_TABLET | Freq: Three times a day (TID) | ORAL | 0 refills | Status: AC | PRN

## 2023-10-22 MED ORDER — KETOROLAC TROMETHAMINE 30 MG/ML IJ SOLN
30.0000 mg | Freq: Once | INTRAMUSCULAR | Status: AC
Start: 2023-10-22 — End: 2023-10-22
  Administered 2023-10-22: 30 mg via INTRAMUSCULAR
  Filled 2023-10-22: qty 1

## 2023-10-22 MED ORDER — PREDNISONE 20 MG PO TABS
60.0000 mg | ORAL_TABLET | Freq: Once | ORAL | Status: AC
  Administered 2023-10-22: 60 mg via ORAL
  Filled 2023-10-22: qty 3

## 2023-10-22 MED ORDER — LIDOCAINE 5 % EX PTCH
1.0000 | MEDICATED_PATCH | CUTANEOUS | 0 refills | Status: AC
Start: 2023-10-22 — End: ?

## 2023-10-22 MED ORDER — ACETAMINOPHEN 500 MG PO TABS
1000.0000 mg | ORAL_TABLET | Freq: Once | ORAL | Status: AC
  Administered 2023-10-22: 1000 mg via ORAL
  Filled 2023-10-22: qty 2

## 2023-10-22 MED ORDER — LIDOCAINE 5 % EX PTCH
1.0000 | MEDICATED_PATCH | CUTANEOUS | Status: DC
  Administered 2023-10-22: 1 via TRANSDERMAL
  Filled 2023-10-22: qty 1

## 2023-10-22 NOTE — ED Triage Notes (Signed)
 Pt presents from home for back pain that started this morning after sharp tingling pain while getting of the couch. Now constant back pain and difficulty moving from sitting to standing or sitting.  Tried motrin at home.  Endorses transient numbness/tingling to L foot Denies incontinence or saddle numbness/tingling; urinary sx

## 2023-10-22 NOTE — Discharge Instructions (Addendum)
 It was a pleasure taking care of you here in the emergency department.  We have started you on few medications to help with your pain.  Steroids--try to take this medication in the morning after eating breakfast Lidocaine patches.  Leave on for 12 hours.  Mostly patchy for 12 hours before placing additional patch Percocet-this medication is a controlled substance.  May make you sleepy.  Use caution as this does have the addictive potential.  Only use for severe pain.  Medication also contains Tylenol.  Do not take any additional Tylenol containing products while taking this medication  If you still symptoms after 1 week please follow-up with your primary care provider  Return for new or worsening symptoms

## 2023-10-22 NOTE — ED Provider Triage Note (Signed)
 Emergency Medicine Provider Triage Evaluation Note  Teresa [name] , a 23 y.o. female  was evaluated in triage.  Pt complains of back pain.  Review of Systems  Positive:  Negative:   Physical Exam  BP 133/89 (BP Location: Right Arm)   Pulse 96   Temp 98.1 [name] (36.7 C)   Resp 16   Wt 122.5 kg   SpO2 100%   BMI 42.93 kg/m  Gen:   Awake, no distress   Resp:  Normal effort  MSK:   Moves extremities without difficulty  Other:    Medical Decision Making  Medically screening exam initiated at 2:55 PM.  Appropriate orders placed.  Teresa [name] was informed that the remainder of the evaluation will be completed by another provider, this initial triage assessment does not replace that evaluation, and the importance of remaining in the ED until their evaluation is complete.  Patient tweaked her back while leaning over today and is having severe pain since. There is tenderness to palpation of right lumbar paraspinal muscles - pain seems to be muscle strain, but patient then became anxious stating that her right toes have been intermittently going numb and she has not urinated since Teresa [name], Teresa [name], Teresa [name] 10/22/23 1457

## 2023-10-22 NOTE — ED Provider Notes (Signed)
 [facility] EMERGENC[facility] DEPARTMENT AT [facility] Provider Note   CSN: [id number] Arrival date & time: 10/22/23  1336    History  Chief Complaint  Patient presents with   Back Pain    Teresa [name] is a 23 y.o. female here for evaluation of lower back pain.  Goes into her right lower extremity.  Began yesterday.  She went to get off the couch and felt a sudden pain to her right lower back.  Radiates into her toes on right.  Difficulty with ambulation due to pain.  No history of similar.  No history of IVDU, bowel or bladder incontinence, saddle paresthesia.  No dysuria or hematuria.  No abdominal pain.  Denies chance of pregnancy. No LE swelling, numbness, weakness.    HPI     Home Medications Prior to Admission medications   Medication Sig Start Date End Date Taking? Authorizing Provider  lidocaine (LIDODERM) 5 % Place 1 patch onto the skin daily. Remove & Discard patch within 12 hours or as directed by MD 10/22/23  Yes Nasim Habeeb A, PA-C  oxyCODONE-acetaminophen (PERCOCET/ROXICET) 5-325 MG tablet Take 1 tablet by mouth every 8 (eight) hours as needed for severe pain (pain score 7-10). 10/22/23  Yes Emani Taussig A, PA-C  predniSONE (STERAPRED UNI-PAK 21 TAB) 10 MG (21) TBPK tablet Take by mouth daily. Take 6 tabs by mouth daily  for 1 days, then 5 tabs for 1 days, then 4 tabs for 1 days, then 3 tabs for 1 days, 2 tabs for 1 days, then 1 tab by mouth daily for 1 days 10/22/23  Yes Marlana Mckowen A, PA-C  phentermine (ADIPEX-P) 37.5 MG tablet Take 1 tablet (37.5 mg total) by mouth daily before breakfast. 10/07/23   Georganna Skeans, MD      Allergies    Patient has no known allergies.    Review of Systems   Review of Systems  Constitutional: Negative.   HENT: Negative.    Respiratory: Negative.    Cardiovascular: Negative.   Gastrointestinal: Negative.   Genitourinary: Negative.   Musculoskeletal:  Positive for back pain.  Skin: Negative.   Neurological:  Negative.   All other systems reviewed and are negative.   Physical Exam Updated Vital Signs BP 133/89 (BP Location: Right Arm)   Pulse 96   Temp 98.1 F (36.7 C)   Resp 16   Wt 122.5 kg   SpO2 100%   BMI 42.93 kg/m  Physical Exam Physical Exam  Constitutional: Pt appears well-developed and well-nourished. No distress.  HENT:  Head: Normocephalic and atraumatic.  Mouth/Throat: Oropharynx is clear and moist. No oropharyngeal exudate.  Eyes: Conjunctivae are normal.  Neck: Normal range of motion. Neck supple.  Full ROM without pain  Cardiovascular: Normal rate, regular rhythm and intact distal pulses.   Pulmonary/Chest: Effort normal and breath sounds normal. No respiratory distress. Pt has no wheezes.  Abdominal: Soft. Pt exhibits no distension. There is no tenderness, rebound or guarding. No abd bruit or pulsatile mass Musculoskeletal:  Full range of motion of the T-spine and L-spine with flexion, hyperextension, and lateral flexion. No midline tenderness or stepoffs. No tenderness to palpation of the spinous processes of the T-spine or L-spine. Mild tenderness to palpation of the paraspinous muscles of the L-spine. positive straight leg raise. Lymphadenopathy:    Pt has no cervical adenopathy.  Neurological: Pt is alert. Pt has normal reflexes.  Speech is clear and goal oriented, follows commands Normal 5/5 strength in upper and lower  extremities bilaterally including dorsiflexion and plantar flexion, strong and equal grip strength Sensation normal to light and sharp touch Moves extremities without ataxia, coordination intact Normal gait Normal balance Skin: Skin is warm and dry. No rash noted or lesions noted. Pt is not diaphoretic. No erythema, ecchymosis,edema or warmth.  Psychiatric: Pt has a normal mood and affect. Behavior is normal.  Nursing note and vitals reviewed.  ED Results / Procedures / Treatments   Labs (all labs ordered are listed, but only abnormal  results are displayed) Labs Reviewed  POC URINE PREG, ED    EKG None  Radiology DG Lumbar Spine Complete Result Date: 10/22/2023 CLINICAL DATA:  Acute right-sided back pain. EXAM: LUMBAR SPINE - COMPLETE 4+ VIEW COMPARISON:  April 25, 2011. FINDINGS: There is no evidence of lumbar spine fracture. Alignment is normal. Minimal degenerative disc disease is noted at L3-4. IMPRESSION: Minimal degenerative disc disease at L3-4. No acute abnormality seen. Electronically Signed   By: Lupita Raider M.D.   On: 10/22/2023 16:12    Procedures Procedures    Medications Ordered in ED Medications  ketorolac (TORADOL) 30 MG/ML injection 30 mg (30 mg Intramuscular Given 10/22/23 1727)  predniSONE (DELTASONE) tablet 60 mg (60 mg Oral Given 10/22/23 1726)  acetaminophen (TYLENOL) tablet 1,000 mg (1,000 mg Oral Given 10/22/23 1726)    ED Course/ Medical Decision Making/ A&P   59 old here for evaluation of right lower back pain radiates into right lower extremity which occurred after getting up quickly off the couch.  She is nonfocal neuroexam without deficits.  She is positive SLR on the right.  She is no clinical evidence of VTE.  No bowel or bladder incontinence, saddle paresthesia, history of IVDU, fever.  No abdominal pain, chance of pregnancy.  Workup started from triage which I personally viewed and interpreted:  Pregnancy test negative X-ray lumbar degenerative changes  Patient reassessed.  Will treat for pain.  Will have her follow-up outpatient, return for any worsening symptoms  At this time low suspicion for cauda equina, discitis, osteomyelitis, transverse myelitis, psoas abscess, VTE, ischemia, torsion, PID, ectopic pregnancy, perforation, obstruction  The patient has been appropriately medically screened and/or stabilized in the ED. I have low suspicion for any other emergent medical condition which would require further screening, evaluation or treatment in the ED or require inpatient  management.  Patient is hemodynamically stable and in no acute distress.  Patient able to ambulate in department prior to ED.  Evaluation does not show acute pathology that would require ongoing or additional emergent interventions while in the emergency department or further inpatient treatment.  I have discussed the diagnosis with the patient and answered all questions.  Pain is been managed while in the emergency department and patient has no further complaints prior to discharge.  Patient is comfortable with plan discussed in room and is stable for discharge at this time.  I have discussed strict return precautions for returning to the emergency department.  Patient was encouraged to follow-up with PCP/specialist refer to at discharge.                                 Medical Decision Making Amount and/or Complexity of Data Reviewed Independent Historian: parent External Data Reviewed: labs and radiology. Labs: ordered. Decision-making details documented in ED Course. Radiology: ordered and independent interpretation performed. Decision-making details documented in ED Course.  Risk OTC drugs. Prescription drug management. Parenteral  controlled substances. Decision regarding hospitalization. Diagnosis or treatment significantly limited by social determinants of health.          Final Clinical Impression(s) / ED Diagnoses Final diagnoses:  Acute right-sided low back pain with right-sided sciatica    Rx / DC Orders ED Discharge Orders          Ordered    predniSONE (STERAPRED UNI-PAK 21 TAB) 10 MG (21) TBPK tablet  Daily        10/22/23 1700    lidocaine (LIDODERM) 5 %  Every 24 hours        10/22/23 1700    oxyCODONE-acetaminophen (PERCOCET/ROXICET) 5-325 MG tablet  Every 8 hours PRN        10/22/23 1700              Gloris Shiroma A, PA-C 10/22/23 2341    Pricilla Loveless, MD 10/26/23 1102

## 2023-11-08 ENCOUNTER — Ambulatory Visit (INDEPENDENT_AMBULATORY_CARE_PROVIDER_SITE_OTHER): Payer: Medicaid Other | Admitting: Family Medicine

## 2023-11-08 ENCOUNTER — Encounter: Payer: Self-pay | Admitting: Family Medicine

## 2023-11-08 VITALS — BP 138/87 | HR 99 | Temp 98.2°F | Resp 16 | Ht 67.0 in | Wt 269.0 lb

## 2023-11-08 DIAGNOSIS — E66813 Obesity, class 3: Secondary | ICD-10-CM

## 2023-11-08 DIAGNOSIS — Z7689 Persons encountering health services in other specified circumstances: Secondary | ICD-10-CM

## 2023-11-08 DIAGNOSIS — Z6841 Body Mass Index (BMI) 40.0 and over, adult: Secondary | ICD-10-CM

## 2023-11-08 MED ORDER — PHENTERMINE HCL 37.5 MG PO TABS: 37.5000 mg | ORAL_TABLET | Freq: Every day | ORAL | 0 refills | Status: DC

## 2023-11-10 ENCOUNTER — Encounter: Payer: Self-pay | Admitting: Family Medicine

## 2023-11-10 NOTE — Progress Notes (Signed)
 Established Patient Office Visit  Subjective    Patient ID: Teresa [name], female    DOB: 2001/06/20  Age: 23 y.o. MRN: [id number]  CC:  Chief Complaint  Patient presents with   Follow-up    One month    HPI Teresa [name] presents for routine weight management. Patient reports she has just restarted med after a hiatus to undergo a procedure.   Outpatient Encounter Medications as of 11/08/2023  Medication Sig   [DISCONTINUED] phentermine (ADIPEX-P) 37.5 MG tablet Take 1 tablet (37.5 mg total) by mouth daily before breakfast.   lidocaine (LIDODERM) 5 % Place 1 patch onto the skin daily. Remove & Discard patch within 12 hours or as directed by MD (Patient not taking: Reported on 11/08/2023)   oxyCODONE-acetaminophen (PERCOCET/ROXICET) 5-325 MG tablet Take 1 tablet by mouth every 8 (eight) hours as needed for severe pain (pain score 7-10). (Patient not taking: Reported on 11/08/2023)   phentermine (ADIPEX-P) 37.5 MG tablet Take 1 tablet (37.5 mg total) by mouth daily before breakfast.   predniSONE (STERAPRED UNI-PAK 21 TAB) 10 MG (21) TBPK tablet Take by mouth daily. Take 6 tabs by mouth daily  for 1 days, then 5 tabs for 1 days, then 4 tabs for 1 days, then 3 tabs for 1 days, 2 tabs for 1 days, then 1 tab by mouth daily for 1 days (Patient not taking: Reported on 11/08/2023)   No facility-administered encounter medications on file as of 11/08/2023.    Past Medical History:  Diagnosis Date   Anemia    Family history of adverse reaction to anesthesia    lidocaine does not work on her mother    Past Surgical History:  Procedure Laterality Date   NO PAST SURGERIES      Family History  Problem Relation Age of Onset   Diabetes Father     Social History   Socioeconomic History   Marital status: Single    Spouse name: Not on file   Number of children: 1   Years of education: Not on file   Highest education level: Associate degree: occupational, Scientist, product/process development, or vocational  program  Occupational History    Comment: Programme researcher, broadcasting/film/video  Tobacco Use   Smoking status: Never    Passive exposure: Never   Smokeless tobacco: Never  Vaping Use   Vaping status: Never Used  Substance and Sexual Activity   Alcohol use: Not Currently   Drug use: Never   Sexual activity: Yes  Other Topics Concern   Not on file  Social History Narrative   Late to care   Social Drivers of Health   Financial Resource Strain: Low Risk  (09/09/2023)   Overall Financial Resource Strain (CARDIA)    Difficulty of Paying Living Expenses: Not very hard  Food Insecurity: No Food Insecurity (09/09/2023)   Hunger Vital Sign    Worried About Running Out of Food in the Last Year: Never true    Ran Out of Food in the Last Year: Never true  Transportation Needs: No Transportation Needs (09/09/2023)   PRAPARE - Administrator, Civil Service (Medical): No    Lack of Transportation (Non-Medical): No  Physical Activity: Sufficiently Active (09/09/2023)   Exercise Vital Sign    Days of Exercise per Week: 5 days    Minutes of Exercise per Session: 30 min  Stress: No Stress Concern Present (09/09/2023)   [employer] of Occupational Health - Occupational Stress Questionnaire    Feeling of Stress :  Not at all  Social Connections: Moderately Integrated (09/09/2023)   Social Connection and Isolation Panel [NHANES]    Frequency of Communication with Friends and Family: More than three times a week    Frequency of Social Gatherings with Friends and Family: Once a week    Attends Religious Services: More than 4 times per year    Active Member of [employer] or Organizations: No    Attends Banker Meetings: Never    Marital Status: Married  Catering manager Violence: Not At Risk (05/20/2023)   Humiliation, Afraid, Rape, and Kick questionnaire    Fear of Current or Ex-Partner: No    Emotionally Abused: No    Physically Abused: No    Sexually Abused: No    Review of Systems  All  other systems reviewed and are negative.       Objective    BP 138/87   Pulse 99   Temp 98.2 F (36.8 C) (Oral)   Resp 16   Ht 5\' 7"  (1.702 m)   Wt 269 lb (122 kg)   SpO2 98%   BMI 42.13 kg/m   Physical Exam Vitals and nursing note reviewed.  Constitutional:      General: She is not in acute distress.    Appearance: She is obese.  Cardiovascular:     Rate and Rhythm: Normal rate and regular rhythm.  Pulmonary:     Effort: Pulmonary effort is normal.     Breath sounds: Normal breath sounds.  Abdominal:     Palpations: Abdomen is soft.     Tenderness: There is no abdominal tenderness.  Neurological:     General: No focal deficit present.     Mental Status: She is alert and oriented to person, place, and time.         Assessment & Plan:   Encounter for weight management  Obesity, Class III, BMI 40-49.9 (morbid obesity) (HCC)  Other orders -     Phentermine HCl; Take 1 tablet (37.5 mg total) by mouth daily before breakfast.  Dispense: 30 tablet; Refill: 0     Return in about 6 weeks (around 12/20/2023) for follow up, weight management.   Teresa Raymond, MD

## 2023-12-20 ENCOUNTER — Ambulatory Visit: Admitting: Family Medicine

## 2023-12-22 ENCOUNTER — Ambulatory Visit: Admitting: Family Medicine

## 2023-12-22 ENCOUNTER — Encounter: Payer: Self-pay | Admitting: Family Medicine

## 2023-12-22 VITALS — BP 121/81 | HR 100 | Wt 269.6 lb

## 2023-12-22 DIAGNOSIS — Z7689 Persons encountering health services in other specified circumstances: Secondary | ICD-10-CM

## 2023-12-22 DIAGNOSIS — Z6841 Body Mass Index (BMI) 40.0 and over, adult: Secondary | ICD-10-CM

## 2023-12-22 MED ORDER — PHENTERMINE HCL 37.5 MG PO TABS: 37.5000 mg | ORAL_TABLET | Freq: Every day | ORAL | 0 refills | Status: AC

## 2023-12-26 ENCOUNTER — Encounter: Payer: Self-pay | Admitting: Family Medicine

## 2023-12-26 NOTE — Progress Notes (Signed)
 Established Patient Office Visit  Subjective    Patient ID: Teresa [name], female    DOB: Nov 16, 2000  Age: 23 y.o. MRN: [id number]  CC:  Chief Complaint  Patient presents with   Weight Management Screening    HPI Teresa [name] presents for routine weight management. She reports that she is feeling stagnant with present management.   Outpatient Encounter Medications as of 12/22/2023  Medication Sig   [DISCONTINUED] phentermine  (ADIPEX-P ) 37.5 MG tablet Take 1 tablet (37.5 mg total) by mouth daily before breakfast.   lidocaine  (LIDODERM ) 5 % Place 1 patch onto the skin daily. Remove & Discard patch within 12 hours or as directed by MD (Patient not taking: Reported on 12/22/2023)   oxyCODONE -acetaminophen  (PERCOCET/ROXICET) 5-325 MG tablet Take 1 tablet by mouth every 8 (eight) hours as needed for severe pain (pain score 7-10). (Patient not taking: Reported on 12/22/2023)   phentermine  (ADIPEX-P ) 37.5 MG tablet Take 1 tablet (37.5 mg total) by mouth daily before breakfast.   predniSONE  (STERAPRED UNI-PAK 21 TAB) 10 MG (21) TBPK tablet Take by mouth daily. Take 6 tabs by mouth daily  for 1 days, then 5 tabs for 1 days, then 4 tabs for 1 days, then 3 tabs for 1 days, 2 tabs for 1 days, then 1 tab by mouth daily for 1 days (Patient not taking: Reported on 12/22/2023)   No facility-administered encounter medications on file as of 12/22/2023.    Past Medical History:  Diagnosis Date   Anemia    Family history of adverse reaction to anesthesia    lidocaine  does not work on her mother    Past Surgical History:  Procedure Laterality Date   NO PAST SURGERIES      Family History  Problem Relation Age of Onset   Diabetes Father     Social History   Socioeconomic History   Marital status: Single    Spouse name: Not on file   Number of children: 1   Years of education: Not on file   Highest education level: Associate degree: occupational, Scientist, product/process development, or vocational program   Occupational History    Comment: Programme researcher, broadcasting/film/video  Tobacco Use   Smoking status: Never    Passive exposure: Never   Smokeless tobacco: Never  Vaping Use   Vaping status: Never Used  Substance and Sexual Activity   Alcohol use: Not Currently   Drug use: Never   Sexual activity: Yes  Other Topics Concern   Not on file  Social History Narrative   Late to care   Social Drivers of Health   Financial Resource Strain: Low Risk  (09/09/2023)   Overall Financial Resource Strain (CARDIA)    Difficulty of Paying Living Expenses: Not very hard  Food Insecurity: No Food Insecurity (09/09/2023)   Hunger Vital Sign    Worried About Running Out of Food in the Last Year: Never true    Ran Out of Food in the Last Year: Never true  Transportation Needs: No Transportation Needs (09/09/2023)   PRAPARE - Administrator, Civil Service (Medical): No    Lack of Transportation (Non-Medical): No  Physical Activity: Sufficiently Active (09/09/2023)   Exercise Vital Sign    Days of Exercise per Week: 5 days    Minutes of Exercise per Session: 30 min  Stress: No Stress Concern Present (09/09/2023)   [employer] of Occupational Health - Occupational Stress Questionnaire    Feeling of Stress : Not at all  Social Connections:  Moderately Integrated (09/09/2023)   Social Connection and Isolation Panel [NHANES]    Frequency of Communication with Friends and Family: More than three times a week    Frequency of Social Gatherings with Friends and Family: Once a week    Attends Religious Services: More than 4 times per year    Active Member of [employer] or Organizations: No    Attends Banker Meetings: Never    Marital Status: Married  Catering manager Violence: Not At Risk (05/20/2023)   Humiliation, Afraid, Rape, and Kick questionnaire    Fear of Current or Ex-Partner: No    Emotionally Abused: No    Physically Abused: No    Sexually Abused: No    Review of Systems  All other  systems reviewed and are negative.       Objective    BP 121/81 (BP Location: Left Arm, Patient Position: Sitting, Cuff Size: Large)   Pulse 100   Wt 269 lb 9.6 oz (122.3 kg)   SpO2 97%   BMI 42.23 kg/m   Physical Exam Vitals and nursing note reviewed.  Constitutional:      General: She is not in acute distress.    Appearance: She is obese.  Cardiovascular:     Rate and Rhythm: Normal rate and regular rhythm.  Pulmonary:     Effort: Pulmonary effort is normal.     Breath sounds: Normal breath sounds.  Abdominal:     Palpations: Abdomen is soft.     Tenderness: There is no abdominal tenderness.  Neurological:     General: No focal deficit present.     Mental Status: She is alert and oriented to person, place, and time.         Assessment & Plan:   Encounter for weight management  Obesity, Class III, BMI 40-49.9 (morbid obesity) (HCC)  Other orders -     Phentermine  HCl; Take 1 tablet (37.5 mg total) by mouth daily before breakfast.  Dispense: 30 tablet; Refill: 0     Return in about 4 weeks (around 01/19/2024) for follow up, weight management.   Arlo Lama, MD

## 2024-01-19 ENCOUNTER — Ambulatory Visit: Admitting: Family Medicine

## 2024-01-19 ENCOUNTER — Encounter: Payer: Self-pay | Admitting: Family Medicine

## 2024-01-19 NOTE — Telephone Encounter (Signed)
Pt scheduled for OV.  

## 2024-01-26 ENCOUNTER — Encounter: Payer: Self-pay | Admitting: Family Medicine

## 2024-01-26 ENCOUNTER — Ambulatory Visit (INDEPENDENT_AMBULATORY_CARE_PROVIDER_SITE_OTHER): Admitting: Family Medicine

## 2024-01-26 VITALS — BP 124/82 | HR 113 | Wt 274.6 lb

## 2024-01-26 DIAGNOSIS — E6609 Other obesity due to excess calories: Secondary | ICD-10-CM

## 2024-01-26 DIAGNOSIS — Z6841 Body Mass Index (BMI) 40.0 and over, adult: Secondary | ICD-10-CM

## 2024-01-26 DIAGNOSIS — Z7689 Persons encountering health services in other specified circumstances: Secondary | ICD-10-CM

## 2024-01-26 DIAGNOSIS — E66813 Obesity, class 3: Secondary | ICD-10-CM | POA: Diagnosis not present

## 2024-01-26 NOTE — Progress Notes (Signed)
 Established Patient Office Visit  Subjective    Patient ID: Teresa [name], female    DOB: 2001-01-20  Age: 23 y.o. MRN: [id number]  CC:  Chief Complaint  Patient presents with   Weight Management Screening    HPI Teresa [name] presents for routine weight management. She reports that she has not done well with present management.   Outpatient Encounter Medications as of 01/26/2024  Medication Sig   phentermine  (ADIPEX-P ) 37.5 MG tablet Take 1 tablet (37.5 mg total) by mouth daily before breakfast.   lidocaine  (LIDODERM ) 5 % Place 1 patch onto the skin daily. Remove & Discard patch within 12 hours or as directed by MD (Patient not taking: Reported on 12/22/2023)   oxyCODONE -acetaminophen  (PERCOCET/ROXICET) 5-325 MG tablet Take 1 tablet by mouth every 8 (eight) hours as needed for severe pain (pain score 7-10). (Patient not taking: Reported on 12/22/2023)   predniSONE  (STERAPRED UNI-PAK 21 TAB) 10 MG (21) TBPK tablet Take by mouth daily. Take 6 tabs by mouth daily  for 1 days, then 5 tabs for 1 days, then 4 tabs for 1 days, then 3 tabs for 1 days, 2 tabs for 1 days, then 1 tab by mouth daily for 1 days (Patient not taking: Reported on 12/22/2023)   No facility-administered encounter medications on file as of 01/26/2024.    Past Medical History:  Diagnosis Date   Anemia    Family history of adverse reaction to anesthesia    lidocaine  does not work on her mother    Past Surgical History:  Procedure Laterality Date   NO PAST SURGERIES      Family History  Problem Relation Age of Onset   Diabetes Father     Social History   Socioeconomic History   Marital status: Single    Spouse name: Not on file   Number of children: 1   Years of education: Not on file   Highest education level: Associate degree: occupational, Scientist, product/process development, or vocational program  Occupational History    Comment: Programme researcher, broadcasting/film/video  Tobacco Use   Smoking status: Never    Passive exposure: Never    Smokeless tobacco: Never  Vaping Use   Vaping status: Never Used  Substance and Sexual Activity   Alcohol use: Not Currently   Drug use: Never   Sexual activity: Yes  Other Topics Concern   Not on file  Social History Narrative   Late to care   Social Drivers of Health   Financial Resource Strain: Low Risk  (09/09/2023)   Overall Financial Resource Strain (CARDIA)    Difficulty of Paying Living Expenses: Not very hard  Food Insecurity: No Food Insecurity (09/09/2023)   Hunger Vital Sign    Worried About Running Out of Food in the Last Year: Never true    Ran Out of Food in the Last Year: Never true  Transportation Needs: No Transportation Needs (09/09/2023)   PRAPARE - Administrator, Civil Service (Medical): No    Lack of Transportation (Non-Medical): No  Physical Activity: Sufficiently Active (09/09/2023)   Exercise Vital Sign    Days of Exercise per Week: 5 days    Minutes of Exercise per Session: 30 min  Stress: No Stress Concern Present (09/09/2023)   [employer] of Occupational Health - Occupational Stress Questionnaire    Feeling of Stress : Not at all  Social Connections: Moderately Integrated (09/09/2023)   Social Connection and Isolation Panel [NHANES]    Frequency of Communication with  Friends and Family: More than three times a week    Frequency of Social Gatherings with Friends and Family: Once a week    Attends Religious Services: More than 4 times per year    Active Member of [employer] or Organizations: No    Attends Banker Meetings: Never    Marital Status: Married  Catering manager Violence: Not At Risk (05/20/2023)   Humiliation, Afraid, Rape, and Kick questionnaire    Fear of Current or Ex-Partner: No    Emotionally Abused: No    Physically Abused: No    Sexually Abused: No    Review of Systems  All other systems reviewed and are negative.       Objective    BP 124/82 (BP Location: Right Arm, Patient Position: Sitting,  Cuff Size: Large)   Pulse (!) 113   Wt 274 lb 9.6 oz (124.6 kg)   SpO2 93%   Breastfeeding No   BMI 43.01 kg/m   Physical Exam Vitals and nursing note reviewed.  Constitutional:      General: She is not in acute distress.    Appearance: She is obese.  Cardiovascular:     Rate and Rhythm: Normal rate and regular rhythm.  Pulmonary:     Effort: Pulmonary effort is normal.     Breath sounds: Normal breath sounds.  Abdominal:     Palpations: Abdomen is soft.     Tenderness: There is no abdominal tenderness.  Neurological:     General: No focal deficit present.     Mental Status: She is alert and oriented to person, place, and time.         Assessment & Plan:   Encounter for weight management  Obesity, Class III, BMI 40-49.9 (morbid obesity)   Patient has not done well. Will take a 3 month hiatus and reassess. Diet and activity options were reviewed.   Return in about 3 months (around 04/27/2024) for follow up, weight management.   Arlo Lama, MD

## 2024-04-14 ENCOUNTER — Emergency Department (HOSPITAL_BASED_OUTPATIENT_CLINIC_OR_DEPARTMENT_OTHER)

## 2024-04-14 ENCOUNTER — Emergency Department (HOSPITAL_BASED_OUTPATIENT_CLINIC_OR_DEPARTMENT_OTHER)
Admission: EM | Admit: 2024-04-14 | Discharge: 2024-04-14 | Disposition: A | Discharge status: Home / Self Care | Attending: Emergency Medicine | Admitting: Emergency Medicine

## 2024-04-14 ENCOUNTER — Encounter (HOSPITAL_BASED_OUTPATIENT_CLINIC_OR_DEPARTMENT_OTHER): Payer: Self-pay

## 2024-04-14 DIAGNOSIS — S92354A Nondisplaced fracture of fifth metatarsal bone, right foot, initial encounter for closed fracture: Secondary | ICD-10-CM | POA: Insufficient documentation

## 2024-04-14 DIAGNOSIS — M79671 Pain in right foot: Secondary | ICD-10-CM | POA: Diagnosis present

## 2024-04-14 DIAGNOSIS — X501XXA Overexertion from prolonged static or awkward postures, initial encounter: Secondary | ICD-10-CM | POA: Diagnosis not present

## 2024-04-14 MED ORDER — IBUPROFEN 400 MG PO TABS
600.0000 mg | ORAL_TABLET | Freq: Once | ORAL | Status: AC
  Administered 2024-04-14: 600 mg via ORAL
  Filled 2024-04-14: qty 1

## 2024-04-14 NOTE — Discharge Instructions (Addendum)
 You have a fracture of the fifth metatarsal of your foot which is the bone connected to your pinky toe.  For this he will need to wear a hard soled shoe such as the postop shoe that I have given you today for the next 3 to 6 weeks.  Use crutches as needed for weightbearing as tolerated.  I recommend follow-up with an orthopedist/bone doctor in 7 to 10 days.  Go ahead and call today to make an appointment.

## 2024-04-14 NOTE — ED Provider Notes (Signed)
 [facility] AT [facility] Provider Note   CSN: [id number] Arrival date & time: 04/14/24  9076     Patient presents with: Foot Injury   Teresa [name] is a 23 y.o. female.    Foot Injury  Patient states that she was playing with her son and stepped funny on her right foot felt a pop and sudden pain.  No other injuries, denies any ankle pain     Prior to Admission medications   Medication Sig Start Date End Date Taking? Authorizing Provider  lidocaine  (LIDODERM ) 5 % Place 1 patch onto the skin daily. Remove & Discard patch within 12 hours or as directed by MD Patient not taking: Reported on 12/22/2023 10/22/23   Henderly, Britni A, PA-C  oxyCODONE -acetaminophen  (PERCOCET/ROXICET) 5-325 MG tablet Take 1 tablet by mouth every 8 (eight) hours as needed for severe pain (pain score 7-10). Patient not taking: Reported on 12/22/2023 10/22/23   Henderly, Britni A, PA-C  phentermine  (ADIPEX-P ) 37.5 MG tablet Take 1 tablet (37.5 mg total) by mouth daily before breakfast. 12/22/23   Tanda Bleacher, MD  predniSONE  (STERAPRED UNI-PAK 21 TAB) 10 MG (21) TBPK tablet Take by mouth daily. Take 6 tabs by mouth daily  for 1 days, then 5 tabs for 1 days, then 4 tabs for 1 days, then 3 tabs for 1 days, 2 tabs for 1 days, then 1 tab by mouth daily for 1 days Patient not taking: Reported on 12/22/2023 10/22/23   Henderly, Britni A, PA-C    Allergies: Patient has no known allergies.    Review of Systems  Updated Vital Signs BP 129/75 (BP Location: Right Arm)   Pulse 81   Temp 98 F (36.7 C) (Oral)   Resp 18   Ht 5' 7 (1.702 m)   Wt 117.9 kg   LMP 03/30/2024 (Approximate)   SpO2 100%   BMI 40.72 kg/m   Physical Exam Vitals and nursing note reviewed.  Constitutional:      General: She is not in acute distress.    Appearance: Normal appearance. She is not ill-appearing.  HENT:     Head: Normocephalic and atraumatic.  Eyes:     General: No scleral icterus.        Right eye: No discharge.        Left eye: No discharge.     Conjunctiva/sclera: Conjunctivae normal.  Pulmonary:     Effort: Pulmonary effort is normal.     Breath sounds: No stridor.  Musculoskeletal:     Comments: Right lower extremity.  Tenderness over the fifth metatarsal at the base of the metatarsal.  No other bony tenderness no lateral or medial malleoli or tenderness no posterior ankle tenderness no instability of ankle joint no knee tenderness or discomfort with range of motion of knee.  DP PT pulses 3+ and symmetric with left side  Left lower extremity normal.  Skin:    General: Skin is warm and dry.  Neurological:     Mental Status: She is alert and oriented to person, place, and time. Mental status is at baseline.     Comments: Sensation bilateral feet normal     (all labs ordered are listed, but only abnormal results are displayed) Labs Reviewed - No data to display  EKG: None  Radiology: DG Foot Complete Right Result Date: 04/14/2024 CLINICAL DATA:  Foot pain fall. EXAM: RIGHT FOOT COMPLETE - 3+ VIEW COMPARISON:  None Available. FINDINGS: Horizontal fracture of the base of the fifth  metatarsal. Nondisplaced fracture. IMPRESSION: Nondisplaced fracture of the base of the fifth metatarsal. Electronically Signed   By: Jackquline Boxer M.D.   On: 04/14/2024 10:23     Procedures   Medications Ordered in the ED  ibuprofen  (ADVIL ) tablet 600 mg (600 mg Oral Given 04/14/24 1015)                                    Medical Decision Making Amount and/or Complexity of Data Reviewed Radiology: ordered.   \Patient states that she was playing with her son and stepped funny on her right foot felt a pop and sudden pain.  No other injuries, denies any ankle pain  Does have proximal fifth metatarsal tenderness.  I personally viewed x-ray appreciate proximal fifth metatarsal fracture.  Early radiology read.  Placed in walking boot for support offered crutches.   Final  diagnoses:  Closed nondisplaced fracture of fifth metatarsal bone of right foot, initial encounter    ED Discharge Orders     None          Neldon Hamp RAMAN, [place] 04/14/24 1903    Simon Lavonia SAILOR, MD 04/14/24 [phone]

## 2024-04-14 NOTE — ED Triage Notes (Signed)
 Last night walking with son fell with son injuring right foot.  Pain with weight bearing.  Pain to outer ankle and bottom of foot.

## 2024-04-27 ENCOUNTER — Encounter: Payer: Self-pay | Admitting: Family Medicine

## 2024-04-27 ENCOUNTER — Ambulatory Visit: Admitting: Family Medicine

## 2024-04-27 VITALS — BP 120/83 | HR 85 | Ht 67.0 in | Wt 283.6 lb

## 2024-04-27 DIAGNOSIS — Z6841 Body Mass Index (BMI) 40.0 and over, adult: Secondary | ICD-10-CM

## 2024-04-27 DIAGNOSIS — Z7689 Persons encountering health services in other specified circumstances: Secondary | ICD-10-CM

## 2024-04-27 DIAGNOSIS — E66813 Obesity, class 3: Secondary | ICD-10-CM | POA: Diagnosis not present

## 2024-04-27 MED ORDER — WEGOVY 0.25 MG/0.5ML ~~LOC~~ SOAJ: 0.2500 mg | SUBCUTANEOUS | 0 refills | Status: DC

## 2024-04-27 NOTE — Progress Notes (Signed)
 Established Patient Office Visit  Subjective    Patient ID: Teresa [name], female    DOB: [birth year]  Age: 23 y.o. MRN: [id number]  CC:  Chief Complaint  Patient presents with   Medical Management of Chronic Issues    HPI [name] presents for weight loss management.   Outpatient Encounter Medications as of 04/27/2024  Medication Sig   ibuprofen  (ADVIL ) 200 MG tablet Take 200 mg by mouth every 6 (six) hours as needed (takes 4 at a time for broken foot).   semaglutide -weight management (WEGOVY ) 0.25 MG/0.5ML SOAJ SQ injection Inject 0.25 mg into the skin once a week.   lidocaine  (LIDODERM ) 5 % Place 1 patch onto the skin daily. Remove & Discard patch within 12 hours or as directed by MD (Patient not taking: Reported on 12/22/2023)   oxyCODONE -acetaminophen  (PERCOCET/ROXICET) 5-325 MG tablet Take 1 tablet by mouth every 8 (eight) hours as needed for severe pain (pain score 7-10). (Patient not taking: Reported on 12/22/2023)   phentermine  (ADIPEX-P ) 37.5 MG tablet Take 1 tablet (37.5 mg total) by mouth daily before breakfast.   predniSONE  (STERAPRED UNI-PAK 21 TAB) 10 MG (21) TBPK tablet Take by mouth daily. Take 6 tabs by mouth daily  for 1 days, then 5 tabs for 1 days, then 4 tabs for 1 days, then 3 tabs for 1 days, 2 tabs for 1 days, then 1 tab by mouth daily for 1 days (Patient not taking: Reported on 12/22/2023)   No facility-administered encounter medications on file as of 04/27/2024.    Past Medical History:  Diagnosis Date   Anemia    Family history of adverse reaction to anesthesia    lidocaine  does not work on her mother    Past Surgical History:  Procedure Laterality Date   NO PAST SURGERIES      Family History  Problem Relation Age of Onset   Diabetes Father     Social History   Socioeconomic History   Marital status: Single    Spouse name: Not on file   Number of children: 1   Years of education: Not on file   Highest education level: 12th grade   Occupational History    Comment: Programme researcher, broadcasting/film/video  Tobacco Use   Smoking status: Never    Passive exposure: Never   Smokeless tobacco: Never  Vaping Use   Vaping status: Never Used  Substance and Sexual Activity   Alcohol use: Yes   Drug use: Never   Sexual activity: Yes  Other Topics Concern   Not on file  Social History Narrative   Late to care   Social Drivers of Health   Financial Resource Strain: Low Risk  (04/27/2024)   Overall Financial Resource Strain (CARDIA)    Difficulty of Paying Living Expenses: Not hard at all  Food Insecurity: No Food Insecurity (04/27/2024)   Hunger Vital Sign    Worried About Running Out of Food in the Last Year: Never true    Ran Out of Food in the Last Year: Never true  Transportation Needs: No Transportation Needs (04/27/2024)   PRAPARE - Administrator, Civil Service (Medical): No    Lack of Transportation (Non-Medical): No  Physical Activity: Insufficiently Active (04/27/2024)   Exercise Vital Sign    Days of Exercise per Week: 2 days    Minutes of Exercise per Session: 20 min  Stress: No Stress Concern Present (04/27/2024)   [employer] of Occupational Health - Occupational Stress Questionnaire  Feeling of Stress: Only a little  Social Connections: Moderately Integrated (04/27/2024)   Social Connection and Isolation Panel    Frequency of Communication with Friends and Family: More than three times a week    Frequency of Social Gatherings with Friends and Family: Twice a week    Attends Religious Services: More than 4 times per year    Active Member of [employer] or Organizations: No    Attends Banker Meetings: Not on file    Marital Status: Married  Catering manager Violence: Not At Risk (05/20/2023)   Humiliation, Afraid, Rape, and Kick questionnaire    Fear of Current or Ex-Partner: No    Emotionally Abused: No    Physically Abused: No    Sexually Abused: No    Review of Systems  All other systems  reviewed and are negative.       Objective    BP 120/83   Pulse 85   Ht 5' 7 (1.702 m)   Wt 283 lb 9.6 oz (128.6 kg)   LMP 03/30/2024 (Approximate)   SpO2 98%   BMI 44.42 kg/m   Physical Exam Vitals and nursing note reviewed.  Constitutional:      General: She is not in acute distress.    Appearance: She is obese.  Cardiovascular:     Rate and Rhythm: Normal rate and regular rhythm.  Pulmonary:     Effort: Pulmonary effort is normal.     Breath sounds: Normal breath sounds.  Abdominal:     Palpations: Abdomen is soft.     Tenderness: There is no abdominal tenderness.  Neurological:     General: No focal deficit present.     Mental Status: She is alert and oriented to person, place, and time.         Assessment & Plan:   Encounter for weight management  Obesity, Class III, BMI 40-49.9 (morbid obesity)  Other orders -     Wegovy ; Inject 0.25 mg into the skin once a week.  Dispense: 2 mL; Refill: 0   The patient will adhere to a reduced calorie diet of 2200     calories per day. The patient will continue lifestyle modifications including diet and   30  minutes of exercise per week.    Return in about 4 weeks (around 05/25/2024) for follow up, weight management.   Tanda Raguel SQUIBB, MD

## 2024-05-02 ENCOUNTER — Telehealth: Payer: Self-pay

## 2024-05-02 NOTE — Telephone Encounter (Signed)
 Pharmacy Patient Advocate Encounter   Received notification from CoverMyMeds that prior authorization for WEGOVY  is required/requested.   Insurance verification completed.   The patient is insured through [employer] .   Per test claim: PA required; PA submitted to above mentioned insurance via CoverMyMeds Key/confirmation #/EOC  [id number] Status is pending

## 2024-05-03 ENCOUNTER — Other Ambulatory Visit: Payer: Self-pay

## 2024-05-28 ENCOUNTER — Other Ambulatory Visit: Payer: Self-pay | Admitting: Family Medicine

## 2024-06-05 ENCOUNTER — Encounter: Payer: Self-pay | Admitting: Family Medicine

## 2024-06-08 ENCOUNTER — Other Ambulatory Visit: Payer: Self-pay

## 2024-06-14 ENCOUNTER — Ambulatory Visit: Admitting: Family Medicine

## 2024-06-14 ENCOUNTER — Telehealth: Payer: Self-pay | Admitting: Family Medicine

## 2024-06-14 NOTE — Telephone Encounter (Signed)
 Called pt and left vm to call office back to reschedule missed appt

## 2024-10-19 ENCOUNTER — Ambulatory Visit: Admitting: Family Medicine
